# Patient Record
Sex: Female | Born: 1990 | Hispanic: Yes | Marital: Single | Smoking: Never smoker
Health system: Southern US, Community
[De-identification: ages and names within clinical notes are randomized; demographics above are authoritative.]

## PROBLEM LIST (undated history)

## (undated) DIAGNOSIS — K838 Other specified diseases of biliary tract: Secondary | ICD-10-CM

## (undated) DIAGNOSIS — K8020 Calculus of gallbladder without cholecystitis without obstruction: Secondary | ICD-10-CM

---

## 2013-02-24 ENCOUNTER — Ambulatory Visit: Payer: No Typology Code available for payment source

## 2013-03-03 ENCOUNTER — Ambulatory Visit (INDEPENDENT_AMBULATORY_CARE_PROVIDER_SITE_OTHER): Payer: No Typology Code available for payment source | Admitting: Family Medicine

## 2013-03-03 ENCOUNTER — Encounter: Payer: Self-pay | Admitting: Family Medicine

## 2013-03-03 VITALS — BP 117/85 | HR 72 | Temp 99.0°F | Ht 60.0 in | Wt 135.4 lb

## 2013-03-03 DIAGNOSIS — R350 Frequency of micturition: Secondary | ICD-10-CM | POA: Insufficient documentation

## 2013-03-03 DIAGNOSIS — N912 Amenorrhea, unspecified: Secondary | ICD-10-CM

## 2013-03-03 LAB — POCT UA - MICROSCOPIC ONLY

## 2013-03-03 LAB — POCT URINALYSIS DIPSTICK
Bilirubin, UA: NEGATIVE
Glucose, UA: NEGATIVE
Ketones, UA: NEGATIVE
Leukocytes, UA: NEGATIVE
Protein, UA: NEGATIVE
pH, UA: 7.5

## 2013-03-03 NOTE — Assessment & Plan Note (Addendum)
PMHx significant for UTI as adolescent. Now urinary frequency. No signs of pyelonephritis.  UA. Will treat accordingly pending results.

## 2013-03-03 NOTE — Progress Notes (Signed)
Family Medicine Office Visit Note   Subjective:   Patient ID: Isabella Archer, female  DOB: [birth year], 22 y.o.. MRN: [id number]   Pt that comes today to establish care in our clinic.  She reports PMHx significant for frequent UTI in the past.   Her other concern is amenorrhea: she reports menstrual period in August 6, and no other menses until October 22. This last period lasted the same amount of time and bleeding was slightly more. She has no concerns about pregnancy since she reports no sexual partner for more than 4 months.   She does reports mild frequency but no dysuria, no flank pain, nausea, vomiting , chills or fever.   Review of Systems:  Pt denies SOB, chest pain, palpitations, headaches, dizziness, numbness or weakness. No changes on urinary or BM habits. No unintentional weigh loss/gain.  Objective:   Physical Exam: Gen:  NAD HEENT: Moist mucous membranes  CV: Regular rate and rhythm, no murmurs rubs or gallops PULM: Clear to auscultation bilaterally. No wheezes/rales/rhonchi ABD: Soft, non tender, non distended, normal bowel sounds. No CVA tenderness.  EXT: No edema Neuro: Alert and oriented x3. No focalization Pt refused to vaginal exam today.  Assessment & Plan:

## 2013-03-03 NOTE — Assessment & Plan Note (Signed)
Irregular menses most likely anovulation.   Sexually active but no sexual intercourse in the last 4 months. No birth control. Plan/ Pregnancy test First time this has happened to her. Exam is reassuring.  Discussed birth control as option for treatment, but at this time will only monitor her menses.  F/u in 3 months or sooner if needed.

## 2013-03-03 NOTE — Patient Instructions (Signed)
Ha sido un placer verle hoy. - Yo le comunicare si los resultados de sus analisis estan [place], de lo contrario lo conversaremos en su proxima cita. - Haga su proxima cita si su problema persiste o tiene nuevos sintomas. De lo contrario su checheo medico es annual.

## 2014-01-24 ENCOUNTER — Encounter (HOSPITAL_COMMUNITY): Payer: Self-pay | Admitting: Emergency Medicine

## 2014-01-24 ENCOUNTER — Emergency Department (HOSPITAL_COMMUNITY): Payer: Self-pay

## 2014-01-24 ENCOUNTER — Emergency Department (HOSPITAL_COMMUNITY)
Admission: EM | Admit: 2014-01-24 | Discharge: 2014-01-24 | Disposition: A | Discharge status: Home / Self Care | Payer: Self-pay | Attending: Emergency Medicine | Admitting: Emergency Medicine

## 2014-01-24 DIAGNOSIS — K8070 Calculus of gallbladder and bile duct without cholecystitis without obstruction: Secondary | ICD-10-CM | POA: Insufficient documentation

## 2014-01-24 DIAGNOSIS — Z3202 Encounter for pregnancy test, result negative: Secondary | ICD-10-CM | POA: Insufficient documentation

## 2014-01-24 DIAGNOSIS — R7989 Other specified abnormal findings of blood chemistry: Secondary | ICD-10-CM | POA: Insufficient documentation

## 2014-01-24 DIAGNOSIS — R1011 Right upper quadrant pain: Secondary | ICD-10-CM | POA: Insufficient documentation

## 2014-01-24 DIAGNOSIS — K8050 Calculus of bile duct without cholangitis or cholecystitis without obstruction: Secondary | ICD-10-CM

## 2014-01-24 DIAGNOSIS — R945 Abnormal results of liver function studies: Secondary | ICD-10-CM

## 2014-01-24 DIAGNOSIS — K8020 Calculus of gallbladder without cholecystitis without obstruction: Secondary | ICD-10-CM

## 2014-01-24 LAB — COMPREHENSIVE METABOLIC PANEL
ALK PHOS: 145 U/L — AB (ref 39–117)
ALT: 233 U/L — ABNORMAL HIGH (ref 0–35)
AST: 359 U/L — AB (ref 0–37)
Albumin: 4.4 g/dL (ref 3.5–5.2)
Anion gap: 12 (ref 5–15)
BILIRUBIN TOTAL: 0.7 mg/dL (ref 0.3–1.2)
BUN: 9 mg/dL (ref 6–23)
CHLORIDE: 102 meq/L (ref 96–112)
CO2: 26 meq/L (ref 19–32)
Calcium: 9.4 mg/dL (ref 8.4–10.5)
Creatinine, Ser: 0.57 mg/dL (ref 0.50–1.10)
GLUCOSE: 103 mg/dL — AB (ref 70–99)
POTASSIUM: 3.8 meq/L (ref 3.7–5.3)
SODIUM: 140 meq/L (ref 137–147)
Total Protein: 8.1 g/dL (ref 6.0–8.3)

## 2014-01-24 LAB — URINALYSIS, ROUTINE W REFLEX MICROSCOPIC
Bilirubin Urine: NEGATIVE
Glucose, UA: NEGATIVE mg/dL
HGB URINE DIPSTICK: NEGATIVE
KETONES UR: NEGATIVE mg/dL
Leukocytes, UA: NEGATIVE
NITRITE: NEGATIVE
PH: 7.5 (ref 5.0–8.0)
Protein, ur: NEGATIVE mg/dL
SPECIFIC GRAVITY, URINE: 1.015 (ref 1.005–1.030)
UROBILINOGEN UA: 1 mg/dL (ref 0.0–1.0)

## 2014-01-24 LAB — CBC WITH DIFFERENTIAL/PLATELET
BASOS ABS: 0 10*3/uL (ref 0.0–0.1)
Basophils Relative: 0 % (ref 0–1)
EOS ABS: 0.1 10*3/uL (ref 0.0–0.7)
Eosinophils Relative: 1 % (ref 0–5)
HCT: 38.1 % (ref 36.0–46.0)
Hemoglobin: 12.9 g/dL (ref 12.0–15.0)
LYMPHS PCT: 28 % (ref 12–46)
Lymphs Abs: 2.2 10*3/uL (ref 0.7–4.0)
MCH: 29.1 pg (ref 26.0–34.0)
MCHC: 33.9 g/dL (ref 30.0–36.0)
MCV: 86 fL (ref 78.0–100.0)
MONO ABS: 1 10*3/uL (ref 0.1–1.0)
Monocytes Relative: 13 % — ABNORMAL HIGH (ref 3–12)
Neutro Abs: 4.5 10*3/uL (ref 1.7–7.7)
Neutrophils Relative %: 58 % (ref 43–77)
PLATELETS: 310 10*3/uL (ref 150–400)
RBC: 4.43 MIL/uL (ref 3.87–5.11)
RDW: 13.3 % (ref 11.5–15.5)
WBC: 7.8 10*3/uL (ref 4.0–10.5)

## 2014-01-24 LAB — LIPASE, BLOOD: Lipase: 26 U/L (ref 11–59)

## 2014-01-24 LAB — POC URINE PREG, ED: PREG TEST UR: NEGATIVE

## 2014-01-24 MED ORDER — ONDANSETRON HCL 4 MG PO TABS: 4.0000 mg | ORAL_TABLET | Freq: Three times a day (TID) | ORAL | Status: DC | PRN

## 2014-01-24 MED ORDER — SODIUM CHLORIDE 0.9 % IV BOLUS (SEPSIS)
1000.0000 mL | Freq: Once | INTRAVENOUS | Status: AC
  Administered 2014-01-24: 1000 mL via INTRAVENOUS

## 2014-01-24 MED ORDER — ONDANSETRON HCL 4 MG/2ML IJ SOLN
4.0000 mg | Freq: Once | INTRAMUSCULAR | Status: AC
  Administered 2014-01-24: 4 mg via INTRAVENOUS
  Filled 2014-01-24: qty 2

## 2014-01-24 MED ORDER — CIPROFLOXACIN HCL 500 MG PO TABS: 500.0000 mg | ORAL_TABLET | Freq: Two times a day (BID) | ORAL | Status: DC

## 2014-01-24 MED ORDER — OXYCODONE-ACETAMINOPHEN 5-325 MG PO TABS: 1.0000 | ORAL_TABLET | ORAL | Status: DC | PRN

## 2014-01-24 MED ORDER — HYDROMORPHONE HCL 1 MG/ML IJ SOLN
1.0000 mg | INTRAMUSCULAR | Status: AC
  Administered 2014-01-24: 1 mg via INTRAVENOUS
  Filled 2014-01-24: qty 1

## 2014-01-24 NOTE — ED Notes (Signed)
PA at BS.  

## 2014-01-24 NOTE — ED Notes (Addendum)
Pt reports fever x 4 days. Having severe RUQ pain and vomiting, pain radiates through to her back. Denies any diarrhea.

## 2014-01-24 NOTE — ED Provider Notes (Signed)
CSN: [id number]     Arrival date & time 01/24/14  1631 History   First MD Initiated Contact with Patient 01/24/14 1913     Chief Complaint  Patient presents with  . Abdominal Pain  . Fever     (Consider location/radiation/quality/duration/timing/severity/associated sxs/prior Treatment) HPI  Patient p/w subjective fever, RUQ, bloating, and nausea that has been intermittent x 4 days.  The pain is intermittent, lasts for hours at a time, seems to be random whether she eats or not.  Radiates through to her back. Pain is severe.  Has taken tylenol and charcoal pills with very mild temporary relief.  Has chronic constipation that is unchanged, last BM was today.  Denies urinary or vaginal complaints.  LMP last week.   History reviewed. No pertinent past medical history. History reviewed. No pertinent past surgical history. History reviewed. No pertinent family history. History  Substance Use Topics  . Smoking status: Never Smoker   . Smokeless tobacco: Not on file  . Alcohol Use: No   OB History   Grav Para Term Preterm Abortions TAB SAB Ect Mult Living                 Review of Systems  All other systems reviewed and are negative.     Allergies  Review of patient's allergies indicates no known allergies.  Home Medications   Prior to Admission medications   Not on File   BP 117/74  Pulse 82  Temp(Src) 98.3 F (36.8 C) (Oral)  Resp 16  SpO2 100%  LMP 01/17/2014 Physical Exam  Nursing note and vitals reviewed. Constitutional: She appears well-developed and well-nourished. No distress.  HENT:  Head: Normocephalic and atraumatic.  Neck: Neck supple.  Cardiovascular: Normal rate and regular rhythm.   Pulmonary/Chest: Effort normal and breath sounds normal. No respiratory distress. She has no wheezes. She has no rales.  Abdominal: Soft. She exhibits no distension. There is tenderness in the right upper quadrant. There is no rebound and no guarding.  Neurological: She is  alert.  Skin: She is not diaphoretic.    ED Course  Procedures (including critical care time) Labs Review Labs Reviewed  COMPREHENSIVE METABOLIC PANEL - Abnormal; Notable for the following:    Glucose, Bld 103 (*)    AST 359 (*)    ALT 233 (*)    Alkaline Phosphatase 145 (*)    All other components within normal limits  CBC WITH DIFFERENTIAL - Abnormal; Notable for the following:    Monocytes Relative 13 (*)    All other components within normal limits  LIPASE, BLOOD  URINALYSIS, ROUTINE W REFLEX MICROSCOPIC  POC URINE PREG, ED    Imaging Review US Abdomen Complete  01/24/2014   CLINICAL DATA:  Right upper quadrant abdominal pain, elevated liver function tests.  EXAM: ULTRASOUND ABDOMEN COMPLETE  COMPARISON:  None.  FINDINGS: Gallbladder:  Multiple gallstones are noted without gallbladder wall thickening or pericholecystic fluid. No sonographic Murphy's sign is noted.  Common bile duct:  Diameter: Measures 8 mm which is mildly dilated.  Liver:  No focal lesion identified. Within normal limits in parenchymal echogenicity.  IVC:  No abnormality visualized.  Pancreas:  Visualized portion unremarkable.  Spleen:  Size and appearance within normal limits.  Right Kidney:  Length: 10 cm. Echogenicity within normal limits. No mass or hydronephrosis visualized.  Left Kidney:  Length: 10.7 cm. Echogenicity within normal limits. No mass or hydronephrosis visualized.  Abdominal aorta:  No aneurysm visualized.  Other findings:  None.  IMPRESSION: Cholelithiasis is noted without evidence of cholecystitis. Mild dilatation of common bile duct is noted; given the history of elevated liver function tests, MRCP is recommended to evaluate for possible distal common bile duct obstruction.   Electronically Signed   By: Roque Lias M.D.   On: 01/24/2014 21:11     EKG Interpretation None      10:27 PM Discussed pt with Dr Silverio Lay.    10:43 PM Discussed patient with Dr Rhea Belton.  He agrees with outpatient ERCP,  the office will call patient tomorrow to have ERCP performed either tomorrow or the next day. Recommends d/c with percocet, cipro, very strict return precautions.  Pt is feeling much better and requests to be discharged home.  She has tenderness in the RUQ but no rebound or guarding and it is much improved from prior exam.    MDM   Final diagnoses:  Symptomatic cholelithiasis  Choledocholithiasis  Elevated LFTs    Afebrile, nontoxic patient with RUQ pain, nausea, intermittent x 4 days.  LFTs elevated.  US shows cholelithiasis and mildly dilitation of CBD.  Suspect choledocholithiasis.  No fever, normal WBC count, doubt cholecystitis or cholangitis.  Pt feeling much better and requested d/c home.  Discussed with GI doctor on call, please see conversation above.  Pt tolerating PO.   D/C home with percocet, zofran, cipro, GI follow up.  Strict return precautions discussed.   Discussed result, findings, treatment, and follow up  with patient.  Pt given return precautions.  Pt verbalizes understanding and agrees with plan.         Cecilia, PA-C 01/25/14 0008

## 2014-01-24 NOTE — ED Notes (Signed)
Discharge instructions reviewed with pt's father.

## 2014-01-24 NOTE — ED Notes (Signed)
Communicated with pt via interpretor phone.

## 2014-01-24 NOTE — ED Notes (Signed)
Pt given water per orders.

## 2014-01-24 NOTE — Discharge Instructions (Signed)
Read the information below.  Use the prescribed medication as directed.  Please discuss all new medications with your pharmacist.  Do not take additional tylenol while taking the prescribed pain medication to avoid overdose.  You may return to the Emergency Department at any time for worsening condition or any new symptoms that concern you.  If you develop high fevers, worsening abdominal pain, uncontrolled vomiting, or are unable to tolerate fluids by mouth, return to the ER for a recheck.    Lea la informacin a continuacin. Use el medicamento recetado como se indica. Por favor, discuta todos los nuevos medicamentos con su farmacutico. No tome Tylenol adicional mientras est tomando el medicamento para el dolor recetada para evitar sobredosis. Usted puede volver a la sala de Oceanographer en cualquier momento por el empeoramiento de la condicin o nuevos sntomas que le preocupan . Si usted desarrolla fiebre alta, empeoramiento del dolor abdominal, vmitos incontrolados , o es incapaz de Web designer lquidos por va oral , volver a la sala de Associate Professor.  The gastroenterologist office will call you tomorrow for a close follow up appointment.  If you have any fevers and any worsening abdominal pain, return to the ER immediately.    La oficina Armed forces operational officer por un estrecho seguimiento cita. Si tiene [place] y dolor abdominal que empeora, volver a la sala de Sports administrator de inmediato .    Clico biliar (Biliary Colic)  El clico biliar es un dolor continuo o irregular en la zona superior del abdomen. Generalmente se ubica debajo de la zona derecha de la caja torcica. Aparece cuando los clculos biliares interfieren con el flujo normal de la bilis que proviene de la vescula. La bilis es un lquido que interviene en la digestin de las Hooper. Se produce en el hgado y se almacena en la vescula. Al comer, La bilis pasa desde la vescula, a travs del conducto cstico y el conducto  biliar comn al intestino delgado. All se mezcla con la comida parcialmente digerida. Si un clculo obstruye alguno de esos conductos, se detiene el flujo normal de bilis. Las clulas del conducto biliar se contraen con fuerza para mover el clculo. Esto causa el dolor del clico biliar.  SNTOMAS  El paciente se queja de dolor en la zona superior del abdomen. El dolor puede ser:  En el centro de la zona superior del abdomen, justo por debajo del esternn.  En la zona superior derecha del abdomen, donde se encuentra la vescula biliar y el hgado.  Se expande hacia la espalda, hacia el omplato derecho.  Nuseas y vmitos  El dolor comienza generalmente despus de comer.  El clico biliar aparece como una demanda de bilis por parte del sistema digestivo. La demanda de bilis es mayor luego de ingerir alimentos ricos en grasas. Los sntomas tambin Armed forces operational officer que han estado ayunando e ingieren abruptamente una comida abundante. La mayora de los episodios de clico biliar mejoran luego de 1 a 5 horas. Despus que se alivia el dolor ms intenso, podr seguir sintiendo un dolor moderado en el abdomen durante un lapso de 24 horas. DIAGNSTICO Luego de escuchar la descripcin de los sntomas, el mdico realizar un examen fsico. Deber prestar atencin a la zona superior del abdomen. Esta es la zona en la que se encuentra el hgado y la vescula biliar. El mdico podr observar los clculos a travs de una ecografa. Tambin le realizaran un escaneo especializado de la vescula biliar. Le indicarn anlisis de [place],  especialmente si tiene fiebre o el dolor persiste. PREVENCIN El clico biliar puede evitarse controlando los factores de riesgo que favorecen los clculos. Algunos de [employer] factores de riesgo como la herencia, el aumento de la edad y Firefighter son aspectos normales de la vida. La obesidad y [place] dieta rica en grasas son factores de riesgo que usted puede modificar a  travs de cambios hacia un estilo de vida saludable. Las mujeres que atraviesan la menopausia y que reciben terapia de reemplazo hormonal (estrgenos) tambin tienen ms riesgo de Environmental education officer clicos biliares. TRATAMIENTO  Le prescribirn analgsicos.  Le indicarn una dieta sin grasas.  Si el primer episodio es intenso, o si los clicos aparecen nuevamente, generalmente se indica la ciruga para extirpar la vescula (colecistectoma). Este procedimiento puede realizarse a travs de pequeas incisiones utilizando un instrumento denominado laparoscopio. Muchas veces se requiere una breve estada en el hospital. Algunas personas reciben el alta el mismo da. Es el tratamiento ms ampliamente utilizado en personas que sufren dolor por clculos biliares. Es efectivo y seguro, no tiene complicaciones en ms del 90% de los [place].  Si la ciruga no puede llevarse a cabo, podrn utilizarse medicamentos para [employer] clculos. Esta medicacin es cara y puede demorar meses o aos hasta que Jensen. Slo podr disolver clculos pequeos.  En algunos casos raros, se combinan estos medicamentos con un procedimiento denominado litotricia por ondas de choque. Este procedimiento [place] ondas de choque cuidadosamente dirigidas a romper los clculos. En muchas personas tratadas con este procedimiento, los clculos vuelven a formarse luego de [employer]. PRONSTICO Si los clculos obstruyen el conducto cstico o conducto biliar comn, usted tiene el riesgo de sufrir episodios repetidos de clicos biliares. Tambin existe un 25% de probabilidades de desarrollar una infeccin de la vescula biliar (colecistitisaguda) o alguna otra complicacin en los siguientes 10 a 20 aos. Si ha sido sometido a [place], progrmela para el momento en que sea conveniente para usted, y para cuando no se encuentre enfermo. INSTRUCCIONES PARA EL CUIDADO DOMICILIARIO  Beba gran cantidad de lquidos claros.  Evite las  comidas con mucha grasa o fritas, o cualquier alimento que empeore su dolor.  Tome los medicamentos como se le indic. SOLICITE ATENCIN MDICA SI:  Le sube la temperatura a ms de 100.5 F (38.1 C).  El dolor empeora con el [place].  Siente nuseas y [employer] comer y beber.  Tiene vmitos. SOLICITE ATENCIN MDICA DE INMEDIATO SI:  Siente dolor continuo e intenso en el abdomen, que no se alivia con medicamentos.  Siente nuseas y vmitos que no mejoran con medicamentos.  Tiene sntomas de clico biliar y comienza a Lodema Pilot y escalofros. Esto puede ser un indicio de que ha desarrollado colecistitis. Comunquese con su mdico inmediatamente.  Su piel o la parte blanca del ojo se vuelven amarillas (ictericia). Document Released: 07/28/2007 Document Revised: 07/13/2011 [facility] Patient Information 2015 [place], [place]. This information is not intended to replace advice given to you by your health care provider. Make sure you discuss any questions you have with your health care provider.    Emergency Department Resource Guide 1) Find a Doctor and Pay Out of Pocket Although you won't have to find out who is covered by your insurance plan, it is a good idea to ask around and get recommendations. You will then need to call the office and see if the doctor you have chosen will accept you as a new patient and what types of options they  offer for patients who are self-pay. Some doctors offer discounts or will set up payment plans for their patients who do not have insurance, but you will need to ask so you aren't surprised when you get to your appointment.  2) Contact Your Local Health Department Not all health departments have doctors that can see patients for sick visits, but many do, so it is worth a call to see if yours does. If you don't know where your local health department is, you can check in your phone book. The CDC also has a tool to help you locate your state's health  department, and many state websites also have listings of all of their local health departments.  3) Find a Walk-in Clinic If your illness is not likely to be very severe or complicated, you may want to try a walk in clinic. These are popping up all over the country in pharmacies, drugstores, and shopping centers. They're usually staffed by nurse practitioners or physician assistants that have been trained to treat common illnesses and complaints. They're usually fairly quick and inexpensive. However, if you have serious medical issues or chronic medical problems, these are probably not your best option.  No Primary Care Doctor: - Call Health Connect at  [phone] - they can help you locate a primary care doctor that  accepts your insurance, provides certain services, etc. - Physician Referral Service- [phone]  Chronic Pain Problems: Organization         Address  Phone   Notes  [facility]  [phone] Patients need to be referred by their primary care doctor.   Medication Assistance: Organization         Address  Phone   Notes  [facility] Medication [facility] [address] [place], [place] [address] [phone] --Must be a resident of [facility] -- Must have NO insurance coverage whatsoever (no Medicaid/ Medicare, etc.) -- The pt. MUST have a primary care doctor that directs their care regularly and follows them in the community   MedAssist  [phone]   [employer]  [phone]    Agencies that provide inexpensive medical care: Organization         Address  Phone   Notes  [facility]  [phone]   [facility]    [phone]   [facility] [address] [place], [place] [address] [phone]   [facility] [address] [place], [place] [phone]   Planned Parenthood    [phone]   [facility]    [phone]    [facility]  [address], [place] Phone:  [phone], Fax:  [phone] Hours of Operation:  9 am - 6 pm, M-F.  Also accepts Medicaid/Medicare and self-pay.  [facility]  [address], [place] Phone: [phone], Fax: [phone]. Hours of Operation:  8:30 am - 5:30 pm, M-F.  Also accepts Medicaid and self-pay.  [facility] [place] [address] [place] Phone: [phone]   [facility] [address] Natasha Bence [place], [place] [phone], Ext. [phone] Mondays & Thursdays: 7-9 AM.  First 15 patients are seen on a first come, first serve basis.    Medicaid-accepting [facility] Providers:  Organization         Address  Phone   Notes  [facility] [address], [place] [phone] Also accepts self-pay patients.  [facility] [address] Laurell Josephs [place], [place]  [phone]   [facility] [address], [place] [phone]   [facility] [address], [place] [phone]   Renaye Rakers [address], [place]   [phone] Only accepts [place] Access [place] patients after they have their name applied to their card.   Self-Pay (no insurance) in [facility]:  Organization         Address  Phone   Notes  Sickle Cell Patients, [facility] Internal Medicine [address] [place], [place] [phone]   [facility] [address] [place], [place] [phone]   [facility] [place]  [address], [place] [phone]   [facility]/Dr. Osei-Bonsu  [address], [place] or [address], [place] [phone] Phone number for both [place] and [place] locations is the same.  [facility] [address] [place] [phone]    [facility] [address], [place] or [address] [phone]   [facility] [address] [place] [phone], phone; [phone], fax Sees patients 1st and 3rd Saturday of every month.  Must not qualify for public or private insurance (i.e. Medicaid, Medicare, [place] Health Choice, Veterans' Benefits)  Household income should be no more than 200% of the poverty level The clinic cannot treat you if you are pregnant or think you are pregnant  Sexually transmitted diseases are not treated at the clinic.    Dental Care: Organization         Address  Phone  Notes  [facility] Department of [facility] [address] [place], [place] [phone] Accepts children up to age 51 who are enrolled in [place] or [place] Health Choice; pregnant women with a Medicaid card; and children who have applied for Medicaid or [place] Health Choice, but were declined, whose parents can pay a reduced fee at time of service.  [facility] Department of [facility]  [address], [place] [phone] Accepts children up to age 74 who are enrolled in [place] or [place] Health Choice; pregnant women with a Medicaid card; and children who have applied for Medicaid or  Health Choice, but were declined, whose parents can pay a reduced fee at time of service.  [facility]  [address] [place], [place] [phone] Patients are seen by appointment only. Walk-ins are not accepted. [facility] will see patients 82 years of age and older. Monday - Tuesday (8am-5pm) Most Wednesdays (8:30-5pm) $30 per visit, cash only  [facility] Adult Dental Access PROGRAM  [address], [facility] [phone] Patients are seen by appointment only. Walk-ins are not accepted. [facility] will see patients 55 years of age and older. One Wednesday Evening (Monthly: Volunteer Based).   $30 per visit, cash only  [employer]  [phone] for adults; Children under age 106, call Graduate Pediatric Dentistry at [phone]. Children aged 5-14, please call [phone] to request a pediatric application.  Dental services are provided in all areas of dental care including fillings, crowns and bridges, complete and partial dentures, implants,  gum treatment, root canals, and extractions. Preventive care is also provided. Treatment is provided to both adults and children. Patients are selected via a lottery and there is often a waiting list.   [facility] [address] [place]  [phone] [email or web address]   [facility] [address] [place], [place] [phone], Ext. [phone] Second and Fourth Thursday of each month, opens at 6:30 AM; Clinic ends at 9 AM.  Patients are seen on a first-come first-served basis, and a limited number are seen during each clinic.   [facility]  [address] Ether Griffins [place], [place] [phone]   Eligibility Requirements You must have lived in [place], [place], or [place] counties for at least the last three months.   You cannot be eligible for state or federal sponsored [place], including [employer], [place], or [employer].   You generally cannot be eligible for healthcare insurance through your employer.    How to apply: Eligibility screenings are held every Tuesday and Wednesday afternoon from 1:00 pm until 4:00 pm. You do not need an appointment for the interview!  [facility] [address] [place], [place] [phone]   [facility] Health Department  [phone]   [facility] Health Department  [phone]   [facility] Health Department  [phone]    Behavioral Health Resources in the Community: Intensive Outpatient Programs Organization         Address  Phone  Notes  [facility] Services [address], [place], [place] [phone]   [facility] [address] [place], [place] [phone]   [facility] [address] [place], [place]  [phone]   [facility] Mental Health [address],  [place], [place] [phone] or [phone]   Substance Abuse Resources Organization         Address  Phone  Notes  [facility]  [phone]   [facility]  [phone]   [facility]  [phone]   Floydene Flock  [phone]   Residential & Outpatient Substance Abuse Program  [phone]   Psychological Services Organization         Address  Phone  Notes  [facility] Behavioral Health  [phone]    [facility]  [phone]   [facility] Mental Health [address] [place] [phone] or [phone]    Mobile Crisis Teams Organization         Address  Phone  Notes  [facility], Mobile Crisis Care Unit  [phone]   [facility]  [address] [place], [place] [phone]   Doristine Locks [address] [place] [phone]    Self-Help/Support Groups Organization         Address  Phone             Notes  [facility] [place] - variety of support groups  336- I7437963 Call for more information  Narcotics Anonymous (NA), [facility] [address], [employer] [place]  2 meetings at this location   Statistician         Address  Phone  Notes  [facility] [address],    [place]  [phone]   [facility]  [address] [place] [address], [place], [place] [phone]   [facility] [address] [place] [address] [place], [place] [phone] Admissions: 8am-3pm M-F  [facility] [address],    [place], [place] [phone]   [facility] [address] [place], [place], [place] [phone]   [facility] [address],  [place], [place] [phone]   [facility] - Intensive Outpatient [address]ure[address] Joseph[address], [place], [place] [phone]   [facility] (Addiction Recovery [facility] [address]  [place], [place] [phone] or [phone]   [facility] ([facility]) [address] [place], [place] [phone] Accepts Medicaid  [facility] [address]  [place] [phone] Substance Abuse/Addiction Treatment   [facility] Organization         Address  Phone  Notes  [facility]  [phone]   Angie Fava, PhD [address] Ervin Knack [place], [place]   [phone] or [phone]   [facility]   [address] [place], [place] [phone]   [facility] [address], [place], [place] [phone] Insurance/Medicaid/sponsorship through [facility] and Families [address]                                    [place], [place] [phone] Therapy/tele-psych/case  [facility] [address], [place] [phone]    Dr. Lolly Mustache  [phone]   [facility]  [employer] [facility] Dept. 1) [address] [place] 2) [address], [place] 3)  [address], [place] [phone]  [phone]   [facility] Child Abuse Hotline 651-605-1171 or [phone] (After Hours)

## 2014-01-25 ENCOUNTER — Telehealth: Payer: Self-pay

## 2014-01-25 ENCOUNTER — Other Ambulatory Visit: Payer: Self-pay

## 2014-01-25 ENCOUNTER — Encounter (HOSPITAL_COMMUNITY): Payer: Self-pay

## 2014-01-25 DIAGNOSIS — K838 Other specified diseases of biliary tract: Secondary | ICD-10-CM

## 2014-01-25 NOTE — ED Provider Notes (Signed)
Medical screening examination/treatment/procedure(s) were performed by non-physician practitioner and as supervising physician I was immediately available for consultation/collaboration.   EKG Interpretation None        Robyn Nohr H Rykker Coviello, MD 01/25/14 1043 

## 2014-01-25 NOTE — Telephone Encounter (Signed)
The pt has been scheduled for EUS ERCP tomorrow at [facility] 1 pm labs also entered for today

## 2014-01-25 NOTE — Telephone Encounter (Signed)
EUS/ERCP tomorrow liver test today Noreene Larsson working on getting the case at [facility] with [facility]

## 2014-01-25 NOTE — Telephone Encounter (Signed)
EUS scheduled, pt instructed and medications reviewed.  Patient instructions mailed to home.  Patient to call with any questions or concerns.  [facility] spoke with the pt and translated the instructions the pt will come in today for labs

## 2014-01-25 NOTE — Progress Notes (Signed)
Spoke with pt via Spanish interpreter Mario #Marquita [phone] and pt stated that she is cancelling her procedure for tomorrow because she has no family here in [place]. She will have the procedure done in another city.

## 2014-01-26 ENCOUNTER — Encounter (HOSPITAL_COMMUNITY): Admission: RE | Payer: Self-pay | Source: Ambulatory Visit

## 2014-01-26 ENCOUNTER — Encounter (HOSPITAL_COMMUNITY): Payer: Self-pay | Admitting: Anesthesiology

## 2014-01-26 ENCOUNTER — Ambulatory Visit (HOSPITAL_COMMUNITY): Admission: RE | Admit: 2014-01-26 | Payer: Self-pay | Source: Ambulatory Visit | Admitting: Gastroenterology

## 2014-01-26 ENCOUNTER — Telehealth: Payer: Self-pay | Admitting: Gastroenterology

## 2014-01-26 SURGERY — ESOPHAGEAL ENDOSCOPIC ULTRASOUND (EUS) RADIAL
Anesthesia: General

## 2014-01-26 NOTE — Telephone Encounter (Signed)
She spoke with pre-admit RN last night and wanted to cancel the appt for EUS +/- ERCP.  Endo staff today called her and verified her decision, it sounds like she will be getting care elsewhere.

## 2014-06-04 HISTORY — PX: LAPAROSCOPIC CHOLECYSTECTOMY: SUR755

## 2015-03-25 ENCOUNTER — Inpatient Hospital Stay (HOSPITAL_COMMUNITY)
Admission: EM | Admit: 2015-03-25 | Discharge: 2015-03-27 | DRG: 444 | Disposition: A | Discharge status: Home / Self Care | Payer: Self-pay | Attending: Family Medicine | Admitting: Family Medicine

## 2015-03-25 ENCOUNTER — Encounter (HOSPITAL_COMMUNITY): Payer: Self-pay | Admitting: *Deleted

## 2015-03-25 ENCOUNTER — Emergency Department (HOSPITAL_COMMUNITY): Payer: Self-pay

## 2015-03-25 DIAGNOSIS — R1114 Bilious vomiting: Secondary | ICD-10-CM | POA: Insufficient documentation

## 2015-03-25 DIAGNOSIS — K226 Gastro-esophageal laceration-hemorrhage syndrome: Secondary | ICD-10-CM | POA: Diagnosis present

## 2015-03-25 DIAGNOSIS — Z9049 Acquired absence of other specified parts of digestive tract: Secondary | ICD-10-CM

## 2015-03-25 DIAGNOSIS — K8051 Calculus of bile duct without cholangitis or cholecystitis with obstruction: Secondary | ICD-10-CM | POA: Diagnosis present

## 2015-03-25 DIAGNOSIS — R1011 Right upper quadrant pain: Secondary | ICD-10-CM | POA: Insufficient documentation

## 2015-03-25 DIAGNOSIS — K8050 Calculus of bile duct without cholangitis or cholecystitis without obstruction: Secondary | ICD-10-CM | POA: Diagnosis present

## 2015-03-25 DIAGNOSIS — K8041 Calculus of bile duct with cholecystitis, unspecified, with obstruction: Principal | ICD-10-CM | POA: Diagnosis present

## 2015-03-25 HISTORY — DX: Other specified diseases of biliary tract: K83.8

## 2015-03-25 HISTORY — DX: Calculus of gallbladder without cholecystitis without obstruction: K80.20

## 2015-03-25 LAB — COMPREHENSIVE METABOLIC PANEL
ALT: 373 U/L — AB (ref 14–54)
AST: 502 U/L — AB (ref 15–41)
Albumin: 4.2 g/dL (ref 3.5–5.0)
Alkaline Phosphatase: 195 U/L — ABNORMAL HIGH (ref 38–126)
Anion gap: 9 (ref 5–15)
BUN: 12 mg/dL (ref 6–20)
CO2: 25 mmol/L (ref 22–32)
CREATININE: 0.62 mg/dL (ref 0.44–1.00)
Calcium: 9.8 mg/dL (ref 8.9–10.3)
Chloride: 103 mmol/L (ref 101–111)
Glucose, Bld: 114 mg/dL — ABNORMAL HIGH (ref 65–99)
POTASSIUM: 4.1 mmol/L (ref 3.5–5.1)
SODIUM: 137 mmol/L (ref 135–145)
Total Bilirubin: 0.9 mg/dL (ref 0.3–1.2)
Total Protein: 8.1 g/dL (ref 6.5–8.1)

## 2015-03-25 LAB — URINALYSIS, ROUTINE W REFLEX MICROSCOPIC
Bilirubin Urine: NEGATIVE
GLUCOSE, UA: NEGATIVE mg/dL
HGB URINE DIPSTICK: NEGATIVE
Ketones, ur: 40 mg/dL — AB
LEUKOCYTES UA: NEGATIVE
Nitrite: NEGATIVE
PH: 6.5 (ref 5.0–8.0)
Protein, ur: NEGATIVE mg/dL
SPECIFIC GRAVITY, URINE: 1.011 (ref 1.005–1.030)

## 2015-03-25 LAB — CBC
HEMATOCRIT: 38.2 % (ref 36.0–46.0)
Hemoglobin: 12.2 g/dL (ref 12.0–15.0)
MCH: 28.6 pg (ref 26.0–34.0)
MCHC: 31.9 g/dL (ref 30.0–36.0)
MCV: 89.5 fL (ref 78.0–100.0)
Platelets: 292 10*3/uL (ref 150–400)
RBC: 4.27 MIL/uL (ref 3.87–5.11)
RDW: 13.3 % (ref 11.5–15.5)
WBC: 8.4 10*3/uL (ref 4.0–10.5)

## 2015-03-25 LAB — I-STAT BETA HCG BLOOD, ED (MC, WL, AP ONLY): I-stat hCG, quantitative: <5 m[IU]/mL (ref <5)

## 2015-03-25 LAB — LIPASE, BLOOD: Lipase: 25 U/L (ref 11–51)

## 2015-03-25 MED ORDER — SODIUM CHLORIDE 0.9 % IV SOLN
1000.0000 mL | Freq: Once | INTRAVENOUS | Status: AC
  Administered 2015-03-25: 1000 mL via INTRAVENOUS

## 2015-03-25 MED ORDER — MORPHINE SULFATE (PF) 2 MG/ML IV SOLN
2.0000 mg | INTRAVENOUS | Status: DC | PRN
  Administered 2015-03-26 – 2015-03-27 (×3): 2 mg via INTRAVENOUS
  Filled 2015-03-25 (×3): qty 1

## 2015-03-25 MED ORDER — SODIUM CHLORIDE 0.9 % IV SOLN
1000.0000 mL | INTRAVENOUS | Status: DC
  Administered 2015-03-25: 1000 mL via INTRAVENOUS

## 2015-03-25 MED ORDER — SODIUM CHLORIDE 0.9 % IV SOLN
INTRAVENOUS | Status: DC
Start: 2015-03-25 — End: 2015-03-25

## 2015-03-25 MED ORDER — DEXTROSE-NACL 5-0.9 % IV SOLN
INTRAVENOUS | Status: DC
  Administered 2015-03-26 (×2): via INTRAVENOUS

## 2015-03-25 MED ORDER — ONDANSETRON HCL 4 MG/2ML IJ SOLN
4.0000 mg | Freq: Four times a day (QID) | INTRAMUSCULAR | Status: DC | PRN
  Administered 2015-03-27: 4 mg via INTRAVENOUS

## 2015-03-25 MED ORDER — ONDANSETRON HCL 4 MG/2ML IJ SOLN
4.0000 mg | Freq: Once | INTRAMUSCULAR | Status: AC
Start: 2015-03-25 — End: 2015-03-25
  Administered 2015-03-25: 4 mg via INTRAVENOUS
  Filled 2015-03-25: qty 2

## 2015-03-25 MED ORDER — ONDANSETRON HCL 4 MG PO TABS: 4.0000 mg | ORAL_TABLET | Freq: Four times a day (QID) | ORAL | Status: DC | PRN

## 2015-03-25 MED ORDER — ONDANSETRON HCL 4 MG/2ML IJ SOLN: 4.0000 mg | Freq: Three times a day (TID) | INTRAMUSCULAR | Status: DC | PRN

## 2015-03-25 MED ORDER — PANTOPRAZOLE SODIUM 40 MG IV SOLR
40.0000 mg | INTRAVENOUS | Status: DC
  Administered 2015-03-26: 40 mg via INTRAVENOUS
  Filled 2015-03-25: qty 40

## 2015-03-25 MED ORDER — HYDROMORPHONE HCL 1 MG/ML IJ SOLN: 1.0000 mg | INTRAMUSCULAR | Status: DC | PRN

## 2015-03-25 MED ORDER — HEPARIN SODIUM (PORCINE) 5000 UNIT/ML IJ SOLN
5000.0000 [IU] | Freq: Three times a day (TID) | INTRAMUSCULAR | Status: DC
  Administered 2015-03-25 – 2015-03-26 (×3): 5000 [IU] via SUBCUTANEOUS
  Filled 2015-03-25 (×2): qty 1

## 2015-03-25 MED ORDER — MORPHINE SULFATE (PF) 4 MG/ML IV SOLN
4.0000 mg | Freq: Once | INTRAVENOUS | Status: AC
  Administered 2015-03-25: 4 mg via INTRAVENOUS
  Filled 2015-03-25: qty 1

## 2015-03-25 NOTE — ED Notes (Signed)
Pt reports upper abd pain x 2 months with nausea. Pain gets worse after she eats. Denies diarrhea.

## 2015-03-25 NOTE — H&P (Signed)
[facility] Admission History and Physical Service Pager: [phone]  Patient name: Isabella Archer Medical record number: [id number] Date of birth: 1991/04/30 Age: 24 y.o. Gender: female  Primary Care Provider: No PCP Per Patient Consultants: Gastroenterology  Code Status: Full  Chief Complaint: Abdominal pain, nausea, vomiting  Assessment and Plan: Isabella Archer is a 24 y.o. female presenting with abdominal pain, nausea, and vomiting 3 months. PMH is significant for s/p cholecystectomy on 06/2014 (in [place]). Patient has had intermittent nausea/vomiting/RUQ pain 3 months. Symptoms are worse with greasy food. Recent cholecystectomy. Lipase wnl, AST/ALT 502/373, alkaline phosphatase 195. Signs and symptoms consistent with cholecystitis/choledocholithiasis. Due to patient's history of cholecystectomy, choledocholithiasis is the likely Dx. this was confirmed with abdominal ultrasound with a CBD dilated to 11 mm. Will monitor for signs/symptoms of cholangitis.  Choledocholithiasis: RUQ pain (worse w/ greasy food) and elevated AST/ALT/Alk Phos. Confirmed with abdominal ultrasound with a CBD dilated to 11 mm. No current indications of cholangitis (fever) at this time.  - Admit to family medicine teaching service; Dr. Mingo Amber attending Center gastroenterology: They will see patient in the morning - NPO after midnight - Morphine 2-4 mg IV every 2 hours when necessary for pain - Zofran when necessary for nausea - D5/NS at 100 mL/hr  Hematemesis: Likely secondary to Mallory-Weiss tears from patient's persistent nausea and vomiting. - Protonix 10m IV >> transition to PO once no longer NPO  Hematochezia, subjective: Patient reported at least 2 episodes of hematochezia 2 weeks ago. Patient currently denies any symptoms more recently. - FOBT to be collected by RN   FEN/GI: D5/NS @100ml /hr, nothing by mouth after midnight Prophylaxis:  Subcutaneous heparin  Disposition: Pending medical improvement  History of Present Illness:  Isabella EAavya Shaferis a 24y.o. female presenting with nausea, vomiting, and abdominal pain 3 months. Patient states that she has had abdominal pain which has been increasing over the past 2 weeks. She states that she has been vomiting due to this pain and nausea shortly after the majority of her meals. The pain appears to be significantly worse when eating anything greasy. She reports more recent hematemesis over the past week with red flecks mixed in her vomit. Patient also reports one or 2 episodes of bilious vomit. Patient endorses occasional diarrhea. She reports 2 episodes of hematochezia approximately 2 weeks ago. She denies any recent fevers, chills, headache, chest pain, shortness of breath, or dysuria.  She denies any history of smoking, NSAID use, or significant coffee consumption. She endorsed some of all use but states she is only consumed approximately 3 drinks over the past month. Patient does report that she was told she had a "problem with her liver" as a child and that she should "avoid foods high in cholesterol". She did not know any other information about this.  Review Of Systems: Per HPI with the following additions: Denies fatigue or body aches. Otherwise the remainder of the systems were negative.  Patient Active Problem List   Diagnosis Date Noted  . Choledocholithiasis 03/25/2015  . Choledocholithiasis with obstruction 03/25/2015  . Bilious vomiting with nausea   . Abdominal pain, right upper quadrant   . Frequency of urination 03/03/2013  . Amenorrhea 03/03/2013    Past Medical History: History reviewed. No pertinent past medical history.  Past Surgical History: Past Surgical History  Procedure Laterality Date  . Cholecystectomy      Social History: Social History  Substance Use Topics  .  Smoking status: Never Smoker   . Smokeless tobacco: None  . Alcohol  Use: No   Additional social history: Originally from [place]  Please also refer to relevant sections of EMR.  Family History: History reviewed. No pertinent family history.  Allergies and Medications: No Known Allergies No current facility-administered medications on file prior to encounter.   Current Outpatient Prescriptions on File Prior to Encounter  Medication Sig Dispense Refill  . ondansetron (ZOFRAN) 4 MG tablet Take 1 tablet (4 mg total) by mouth every 8 (eight) hours as needed for nausea or vomiting. (Patient not taking: Reported on 03/25/2015) 12 tablet 0  . oxyCODONE-acetaminophen (PERCOCET/ROXICET) 5-325 MG per tablet Take 1-2 tablets by mouth every 4 (four) hours as needed for moderate pain or severe pain ((Dolor)). (Patient not taking: Reported on 03/25/2015) 20 tablet 0    Objective: BP 117/59 mmHg  Pulse 74  Temp(Src) 98.6 F (37 C) (Oral)  Resp 16  Ht 5' (1.524 m)  Wt 121 lb 0.5 oz (54.9 kg)  BMI 23.64 kg/m2  SpO2 100%  LMP 03/19/2015 Exam: General -- oriented x3, pleasant and cooperative. HEENT -- Head is normocephalic. PERRLA. EOMI. Ears, nose and throat were benign. Integument -- intact. No jaundice. No rash, erythema, or ecchymoses.  Chest -- good expansion. Lungs clear to auscultation. Cardiac -- RRR. No murmurs noted.  Abdomen -- soft. RUQ tenderness. +Murphy's.  No masses palpable. Bowel sounds present. CNS -- cranial nerves II through XII grossly intact. 2+ reflexes bilaterally. Extremeties - no tenderness or effusions noted. ROM good. 5/5 bilateral strength. Dorsalis pedis pulses present and symmetrical.    Labs and Imaging: CBC BMET   Recent Labs Lab 03/25/15 1300  WBC 8.4  HGB 12.2  HCT 38.2  PLT 292    Recent Labs Lab 03/25/15 1300  NA 137  K 4.1  CL 103  CO2 25  BUN 12  CREATININE 0.62  GLUCOSE 114*  CALCIUM 9.8     Abdominal US 11/21 IMPRESSION: Choledocholithiasis with dilatation of the common bile duct to 11 mm and  perhaps mild central intrahepatic biliary ductal dilatation. The distal common bile duct stone measures approximately 11 x 5 mm.   Elberta Leatherwood, MD 03/25/2015, 11:47 PM PGY-2, [facility] pager: [phone], text pages welcome

## 2015-03-25 NOTE — Progress Notes (Signed)
Pt arrived to 5W07. Pt is alert and oriented X4. Pt oriented to room and instructed how to use the phone and call bell. Phone and call bell at patient bedside within in reach. Skin is clean, dry, and intact per RN staff. Pt has small bruise on right upper thigh. IV in right hand saline locked. IV site is clean, dry, and intact. Pt is spanish speaking with limited English; pt's roommates are at bed side. Pt complains of 4/10 abdominal pain; received IV pain medicine in the ED and does not want anything for pain at this time.  Will continue to monitor and treat per MD orders.

## 2015-03-25 NOTE — ED Provider Notes (Signed)
CSN: [id number]     Arrival date & time 03/25/15  1225 History   First MD Initiated Contact with Patient 03/25/15 1600     Chief Complaint  Patient presents with  . Abdominal Pain  . Nausea     (Consider location/radiation/quality/duration/timing/severity/associated sxs/prior Treatment) HPI Patient had evaluation in September 2015 whereupon she had multiple gallstones and elevated LFTs. Subsequent to that she return to the [place] where she had a cholecystectomy done in February 2016. She reports she felt well for approximately 3 months after that. Now however for the past 2 months she's been getting pain, bloating and vomiting with eating. She reports the pain is in her right upper quadrant. It has worsened significantly now over the past several days. She denies fever. History reviewed. No pertinent past medical history. Past Surgical History  Procedure Laterality Date  . Cholecystectomy     History reviewed. No pertinent family history. Social History  Substance Use Topics  . Smoking status: Never Smoker   . Smokeless tobacco: None  . Alcohol Use: No   OB History    No data available     Review of Systems 10 Systems reviewed and are negative for acute change except as noted in the HPI.    Allergies  Review of patient's allergies indicates no known allergies.  Home Medications   Prior to Admission medications   Medication Sig Start Date End Date Taking? Authorizing Provider  ciprofloxacin (CIPRO) 500 MG tablet Take 1 tablet (500 mg total) by mouth 2 (two) times daily. One po bid x 7 days Patient not taking: Reported on 03/25/2015 01/24/14   Trixie DredgeEmily West, PA-C  ondansetron (ZOFRAN) 4 MG tablet Take 1 tablet (4 mg total) by mouth every 8 (eight) hours as needed for nausea or vomiting. Patient not taking: Reported on 03/25/2015 01/24/14   Trixie DredgeEmily West, PA-C  oxyCODONE-acetaminophen (PERCOCET/ROXICET) 5-325 MG per tablet Take 1-2 tablets by mouth every 4 (four) hours as needed  for moderate pain or severe pain ((Dolor)). Patient not taking: Reported on 03/25/2015 01/24/14   Trixie DredgeEmily West, PA-C   BP 119/62 mmHg  Pulse 67  Temp(Src) 99 F (37.2 C) (Oral)  Resp 20  Ht 5' (1.524 m)  Wt 119 lb 5 oz (54.12 kg)  BMI 23.30 kg/m2  SpO2 100%  LMP 03/19/2015 Physical Exam  Constitutional: She is oriented to person, place, and time. She appears well-developed and well-nourished.  HENT:  Head: Normocephalic and atraumatic.  Mouth/Throat: Oropharynx is clear and moist.  Eyes: EOM are normal. Pupils are equal, round, and reactive to light.  Neck: Neck supple.  Cardiovascular: Normal rate, regular rhythm, normal heart sounds and intact distal pulses.   Pulmonary/Chest: Effort normal and breath sounds normal.  Abdominal: Soft. Bowel sounds are normal. She exhibits no distension. There is tenderness.  Right upper quadrant and epigastric pain to palpation. I do not appreciate hepatomegaly. Remainder of the abdomen is soft.  Musculoskeletal: Normal range of motion. She exhibits no edema or tenderness.  Neurological: She is alert and oriented to person, place, and time. She has normal strength. She exhibits normal muscle tone. Coordination normal. GCS eye subscore is 4. GCS verbal subscore is 5. GCS motor subscore is 6.  Skin: Skin is warm, dry and intact.  Psychiatric: She has a normal mood and affect.    ED Course  Procedures (including critical care time) Labs Review Labs Reviewed  COMPREHENSIVE METABOLIC PANEL - Abnormal; Notable for the following:    Glucose, Bld 114 (*)  AST 502 (*)    ALT 373 (*)    Alkaline Phosphatase 195 (*)    All other components within normal limits  LIPASE, BLOOD  CBC  URINALYSIS, ROUTINE W REFLEX MICROSCOPIC (NOT AT [facility])  HEPATITIS PANEL, ACUTE  I-STAT BETA HCG BLOOD, ED ([facility], [facility], [facility] ONLY)    Imaging Review US Abdomen Limited  03/25/2015  CLINICAL DATA:  24 year old female with elevated LFTs status post cholecystectomy EXAM: US  ABDOMEN LIMITED - RIGHT UPPER QUADRANT COMPARISON:  Prior abdominal ultrasound 01/24/2014 FINDINGS: Gallbladder: Surgically absent. Common bile duct: Diameter: Dilated at 11 mm. Additionally, there is and echogenic structure with posterior acoustic shadowing in the distal common duct measuring 11 x 5 mm. Liver: No focal lesion identified. Within normal limits in parenchymal echogenicity. Mild intrahepatic biliary ductal dilatation. IMPRESSION: Choledocholithiasis with dilatation of the common bile duct to 11 mm and perhaps mild central intrahepatic biliary ductal dilatation. The distal common bile duct stone measures approximately 11 x 5 mm. Electronically Signed   By: Malachy Moan M.D.   On: 03/25/2015 18:07   I have personally reviewed and evaluated these images and lab results as part of my medical decision-making.   EKG Interpretation None     Case reviewed with Dr. Edrick Oh of GI. Nothing by mouth after midnight and will evaluate in the morning for definitive management. MDM   Final diagnoses:  Calculus of bile duct with obstruction and without cholangitis or cholecystitis   Patient is nontoxic alert. Vital signs are stable. Patient has choledocholithiasis with duct dilation. LFTs are elevated. Definitive management by GI with admission to family medicine service.    Arby Barrette, MD 03/25/15 [phone]

## 2015-03-26 ENCOUNTER — Encounter (HOSPITAL_COMMUNITY): Payer: Self-pay | Admitting: Physician Assistant

## 2015-03-26 DIAGNOSIS — K8050 Calculus of bile duct without cholangitis or cholecystitis without obstruction: Secondary | ICD-10-CM

## 2015-03-26 DIAGNOSIS — K8051 Calculus of bile duct without cholangitis or cholecystitis with obstruction: Secondary | ICD-10-CM | POA: Insufficient documentation

## 2015-03-26 DIAGNOSIS — R935 Abnormal findings on diagnostic imaging of other abdominal regions, including retroperitoneum: Secondary | ICD-10-CM

## 2015-03-26 DIAGNOSIS — R748 Abnormal levels of other serum enzymes: Secondary | ICD-10-CM

## 2015-03-26 LAB — COMPREHENSIVE METABOLIC PANEL
ALBUMIN: 3 g/dL — AB (ref 3.5–5.0)
ALK PHOS: 177 U/L — AB (ref 38–126)
ALT: 673 U/L — AB (ref 14–54)
AST: 563 U/L — AB (ref 15–41)
Anion gap: 4 — ABNORMAL LOW (ref 5–15)
BILIRUBIN TOTAL: 1.4 mg/dL — AB (ref 0.3–1.2)
CO2: 25 mmol/L (ref 22–32)
CREATININE: 0.6 mg/dL (ref 0.44–1.00)
Calcium: 8.4 mg/dL — ABNORMAL LOW (ref 8.9–10.3)
Chloride: 109 mmol/L (ref 101–111)
GFR calc Af Amer: >60 mL/min (ref >60)
GFR calc non Af Amer: >60 mL/min (ref >60)
GLUCOSE: 123 mg/dL — AB (ref 65–99)
POTASSIUM: 4.2 mmol/L (ref 3.5–5.1)
Sodium: 138 mmol/L (ref 135–145)
TOTAL PROTEIN: 5.8 g/dL — AB (ref 6.5–8.1)

## 2015-03-26 LAB — CBC
HEMATOCRIT: 32.5 % — AB (ref 36.0–46.0)
Hemoglobin: 10.7 g/dL — ABNORMAL LOW (ref 12.0–15.0)
MCH: 29.6 pg (ref 26.0–34.0)
MCHC: 32.9 g/dL (ref 30.0–36.0)
MCV: 90 fL (ref 78.0–100.0)
Platelets: 242 10*3/uL (ref 150–400)
RBC: 3.61 MIL/uL — ABNORMAL LOW (ref 3.87–5.11)
RDW: 13.5 % (ref 11.5–15.5)
WBC: 4.6 10*3/uL (ref 4.0–10.5)

## 2015-03-26 LAB — HEPATITIS PANEL, ACUTE
HCV Ab: 0.2 s/co ratio (ref 0.0–0.9)
HEP A IGM: NEGATIVE
Hep B C IgM: NEGATIVE
Hepatitis B Surface Ag: NEGATIVE

## 2015-03-26 NOTE — Care Management Note (Signed)
Case Management Note  Patient Details  Name: Donny Piquerleth [name] MRN: [id number] Date of Birth: 12/22/1990  Subjective/Objective:                 Independent patient from home, admitted with abdominal pain, post cholecystectomy 3 months ago. NPO, IVF, GI consult.   Action/Plan:  Spoke with Fam Med resident. They are to decide if their office is accepting patients. Patient had a visit in October 2014. If not, [facility]  Expected Discharge Date:                  Expected Discharge Plan:  Home/Self Care  In-House Referral:     Discharge planning Services  CM Consult  Post Acute Care Choice:    Choice offered to:     DME Arranged:    DME Agency:     HH Arranged:    HH Agency:     Status of Service:  In process, will continue to follow  Medicare Important Message Given:    Date Medicare IM Given:    Medicare IM give by:    Date Additional Medicare IM Given:    Additional Medicare Important Message give by:     If discussed at Long Length of Stay Meetings, dates discussed:    Additional Comments:  Lawerance SabalDebbie Nicholas Ossa, RN 03/26/2015, 2:14 PM

## 2015-03-26 NOTE — Consult Note (Addendum)
[facility] Consult: 2:49 PM 03/26/2015  LOS: 1 day    Referring Provider: Dr. Mingo Amber  Primary Care Physician:  No PCP Per Patient Primary Gastroenterologist:  unassigned     Reason for Consultation:  Choledocholithiasis   HPI: Isabella Archer is a 24 y.o. female.  Generally healthy young woman. Diagnosed with cholelithiasis and 30m CBD by ultrasound in 01/2014. LFTs elevated then: AST/ALT 359/233, alk phos 145.  Normal T bili.   An ERCP/EUS was arranged but pt cancelled the procedure as [place] tickets [place] to [place] there, in 06/2014, patient underwent uneventful laparoscopic cholecystectomy.  For a few months she was GI symptom free. However she subsequently had recurrent symptoms of upper abdominal pain, nausea and vomiting. These have become much worse in the last week. They are precipitated by greasy food..  Seen in ED yesterday.  Admitted to teaching service.  AST/ALT 563/637, alk phos 177, T bili 1.4. Ultrasound with 163mCBD, choledocholithiasis.   Family history positive for cholecystectomy in her mother and a maternal aunt. Patient doesn't drink alcohol.    Past Medical History  Diagnosis Date  . Cholelithiasis 01/2014.  . Dilated cbd, acquired 01/2014.    Past Surgical History  Procedure Laterality Date  . Laparoscopic cholecystectomy  06/2014    [place]  Prior to Admission medications   Medication Sig Start Date End Date Taking? Authorizing Provider  ondansetron (ZOFRAN) 4 MG tablet Take 1 tablet (4 mg total) by mouth every 8 (eight) hours as needed for nausea or vomiting. Patient not taking: Reported on 03/25/2015 01/24/14   EmClayton BiblesPA-C  oxyCODONE-acetaminophen (PERCOCET/ROXICET) 5-325 MG per tablet Take 1-2 tablets by mouth every 4 (four)  hours as needed for moderate pain or severe pain ((Dolor)). Patient not taking: Reported on 03/25/2015 01/24/14   EmClayton BiblesPA-C    Scheduled Meds: . heparin  5,000 Units Subcutaneous 3 times per day   Infusions: . dextrose 5 % and 0.9% NaCl 100 mL/hr at 03/26/15 1130   PRN Meds: morphine injection, ondansetron **OR** ondansetron (ZOFRAN) IV   Allergies as of 03/25/2015  . (No Known Allergies)    History reviewed. No pertinent family history.  Social History   Social History  . Marital Status: Single    Spouse Name: N/A  . Number of Children: N/A  . Years of Education: N/A   Occupational History  . 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   Social History Main Topics  . Smoking status: Never Smoker   . Smokeless tobacco: Not on file  . Alcohol Use: No  . Drug Use: No  . Sexual Activity: Yes    Birth Control/ Protection: None   Other Topics Concern  . Not on file   Social History Narrative    REVIEW OF SYSTEMS: Constitutional:  Has lost a few pounds but no dramatic weight loss. ENT:  No nose bleeds Pulm:   no dyspnea, no cough. CV:  No palpitations, no LE edema.  no  chest pain.  GU:  No hematuria, no frequency Gyn:  Occasionally has heavy periods but generally does not have menorrhagia. Periods are regular. No previous pregnancies.  GI:  Per HPI Heme:   denies excessive bleeding or bruising. Has never been told she had anemia.  Transfusions:   none ever. Neuro:  No headaches, no peripheral tingling or numbness Derm:  No itching, no rash or sores.  Endocrine:  No sweats or chills.  No polyuria or dysuria Immunization:   did not inquire. Travel:  None beyond local counties in last few months.    PHYSICAL EXAM: Vital signs in last 24 hours: Filed Vitals:   03/26/15 0532 03/26/15 1425  BP: 110/60 110/78  Pulse:  65  Temp:  98.3 F (36.8 C)  Resp:  16   Wt Readings from Last 3 Encounters:  03/25/15 54.9 kg (121 lb 0.5  oz)  03/03/13 61.417 kg (135 lb 6.4 oz)    GenPleasant, well-appearing young woman who is comfortable. Head:   no facial asymmetry, swelling or signs of trauma.  Eyes:   no scleral icterus, no conjunctival pallor. EOMI. Ears:   not hard of hearing.  Nose:   no nasal discharge or congestion Mouth:   clear, moist oral MM. Teeth excellent Neck:   no masses, TMG, JVD. Lungs:   clear to auscultation and percussion. No shortness of breath or cough HeaRRR. No MRG. S1/S2 audible. Abdomen:   soft, slightly tender in the epigastric/RUQ region. No guarding or rebound. No distention. Active bowel sounds. No HSM.Marland Kitchen   Rectal:  Deferred   Musc/Skeletal:   no joint deformity, swelling, erythema. Extremities:  no CCE. Feet are warm with 3+ pedal pulses.  Neurologic:  alert. Oriented 3. No tremor. No limb weakness. Skin:  Olive tone to the skin. I'm not able to appreciate jaundice. Tattoos:  none Nodes:  No cervical or inguinal adenopathy   Psych:  pleasant, calm. In good spirits.  Intake/Output from previous day: 11/21 0701 - 11/22 0700 In: 1000 [I.V.:1000] Out: 750 [Urine:750] Intake/Output this shift: Total I/O In: 0  Out: 550 [Urine:550]  LAB RESULTS:  Recent Labs  03/25/15 1300 03/26/15 0542  WBC 8.4 4.6  HGB 12.2 10.7*  HCT 38.2 32.5*  PLT 292 242   BMET Lab Results  Component Value Date   NA 138 03/26/2015   NA 137 03/25/2015   NA 140 01/24/2014   K 4.2 03/26/2015   K 4.1 03/25/2015   K 3.8 01/24/2014   CL 109 03/26/2015   CL 103 03/25/2015   CL 102 01/24/2014   CO2 25 03/26/2015   CO2 25 03/25/2015   CO2 26 01/24/2014   GLUCOSE 123* 03/26/2015   GLUCOSE 114* 03/25/2015   GLUCOSE 103* 01/24/2014   BUN <5* 03/26/2015   BUN 12 03/25/2015   BUN 9 01/24/2014   CREATININE 0.60 03/26/2015   CREATININE 0.62 03/25/2015   CREATININE 0.57 01/24/2014   CALCIUM 8.4* 03/26/2015   CALCIUM 9.8 03/25/2015   CALCIUM 9.4 01/24/2014   LFT  Recent Labs  03/25/15 1300  03/26/15 0542  PROT 8.1 5.8*  ALBUMIN 4.2 3.0*  AST 502* 563*  ALT 373* 673*  ALKPHOS 195* 177*  BILITOT 0.9 1.4*   PT/INR No results found for: INR, PROTIME Hepatitis Panel  Recent Labs  03/25/15 1645  HEPBSAG Negative  HCVAB 0.2  HEPAIGM Negative  HEPBIGM Negative   C-Diff No components found for: CDIFF Lipase     Component Value Date/Time  LIPASE 25 03/25/2015 1300    Drugs of Abuse  No results found for: LABOPIA, COCAINSCRNUR, LABBENZ, AMPHETMU, THCU, LABBARB   RADIOLOGY STUDIES: US Abdomen Limited  03/25/2015  CLINICAL DATA:  24 year old female with elevated LFTs status post cholecystectomy EXAM: US ABDOMEN LIMITED - RIGHT UPPER QUADRANT COMPARISON:  Prior abdominal ultrasound 01/24/2014 FINDINGS: Gallbladder: Surgically absent. Common bile duct: Diameter: Dilated at 11 mm. Additionally, there is and echogenic structure with posterior acoustic shadowing in the distal common duct measuring 11 x 5 mm. Liver: No focal lesion identified. Within normal limits in parenchymal echogenicity. Mild intrahepatic biliary ductal dilatation. IMPRESSION: Choledocholithiasis with dilatation of the common bile duct to 11 mm and perhaps mild central intrahepatic biliary ductal dilatation. The distal common bile duct stone measures approximately 11 x 5 mm. Electronically Signed   By: Jacqulynn Cadet M.D.   On: 03/25/2015 18:07    ENDOSCOPIC STUDIES none  IMPRESSION:   *  Choledocholilthiasis  *  S/p ERCP in [place].   *     normocytic anemia following IV fluid hydration.     PLAN:     *  ERCP arranged for 11:30 tomorrow with [facility]. Risks of bleeding, pancreatitis, intestinal or biliary tract perforation, inability to remove stone and possible need for stent placement discussed with patient and her father using Internet-based anatomy diagrams. She is agreeable to proceed.   Azucena Freed  03/26/2015, 2:49 PM Pager: [phone]  GI ATTENDING  History,labs,x-rays, prior ER  encounter reviewed. Patient seen and examined. agrre with consultation note as outlined above. She has symptomatic choledocholithiasis. Could have been addressed in September, but she cancelled arranged EUS +/-ERCP. Plan for ERCP tomorrow. The nature of the procedure, as well as the risks, benefits, and alternatives were carefully and thoroughly reviewed with the patient. Ample time for discussion and questions allowed. The patient understood, was satisfied, and agreed to proceed.   Docia Chuck. Geri Seminole., M.D. [facility] Division of Gastroenterology

## 2015-03-26 NOTE — Progress Notes (Signed)
Family Medicine Teaching Service Daily Progress Note Intern Pager: [phone]  Patient name: Isabella Archer Medical record number: [id number] Date of birth: 07/17/1990 Age: 24 y.o. Gender: female  Primary Care Provider: No PCP Per Patient Consultants: Gastroenterology Code Status: Full  Pt Overview and Major Events to Date:  03/25/15 - distal common bile duct stone seen on abdominal U/S  Assessment and Plan: Isabella Archer is a 24 y.o. female presenting with abdominal pain, nausea, and vomiting 3 months. PMH is significant for s/p cholecystectomy on 06/2014 (in [place]   Abdominal pain: Consistent with choledocholithiasis, confirmed with abdominal U/S with CBD dilated to 11 mm. RUQ pain (worse w/ greasy food) and elevated AST/ALT 502/373, alkaline phosphatase 195. Lipase wnl. Signs and symptoms consistent with cholecystitis/choledocholithiasis. No current indications of cholangitis (fever) at this time. Pregnancy test negative.  - Gastroenterology consulted, will see patient morning of 11/22.  - Was made NPO after midnight - Hepatitis panel: negative for Hep B, Hep C - Morphine 2-4 mg IV every 2 hours when necessary for pain - Zofran when necessary for nausea - D5/NS at 100 mL/hr - Glucose elevated to 114/123. Likely stress response.  - Bilirubin increased from 0.9 to 1.4. - Anticipate need for ERCP.   Hematemesis: Likely secondary to Mallory-Weiss tears from patient's persistent nausea and vomiting. - H/H down to 10.7/32.5 from 12.2/38.2 on admission but may be hemodilution.  - Protonix 40mg  IV >> transition to PO once no longer NPO  Hematochezia, subjective: Patient reported at least 2 episodes of hematochezia 2 weeks ago. Patient currently denies any symptoms more recently. - FOBT to be collected by RN  FEN/GI: D5/NS @100ml /hr, nothing by mouth after midnight Prophylaxis: Subcutaneous heparin  Disposition: Pending medical improvement and GI  recommendations for or against ERCP  Subjective:  Patient's pain well controlled on morphine. She rates it at a 2 out of 10. She denies nausea at present. She has not had any vomiting.  Objective: Temp:  [97.9 F (36.6 C)-99 F (37.2 C)] 97.9 F (36.6 C) (11/22 0521) Pulse Rate:  [55-84] 55 (11/22 0521) Resp:  [16-20] 16 (11/22 0521) BP: (99-121)/(50-76) 110/60 mmHg (11/22 0532) SpO2:  [96 %-100 %] 99 % (11/22 0521) Weight:  [119 lb 5 oz (54.12 kg)-121 lb 0.5 oz (54.9 kg)] 121 lb 0.5 oz (54.9 kg) (11/21 2020) Physical Exam: General: Well-appearing female resting in bed in NAD, father at bedside Cardiovascular: RRR, S1, S2, no m/r/g Respiratory: CTAB Abdomen: positive Murphy's sign, RUQ point tenderness Extremities: FROM  Laboratory:  Recent Labs Lab 03/25/15 1300 03/26/15 0542  WBC 8.4 4.6  HGB 12.2 10.7*  HCT 38.2 32.5*  PLT 292 242    Recent Labs Lab 03/25/15 1300 03/26/15 0542  NA 137 138  K 4.1 4.2  CL 103 109  CO2 25 25  BUN 12 <5*  CREATININE 0.62 0.60  CALCIUM 9.8 8.4*  PROT 8.1 5.8*  BILITOT 0.9 1.4*  ALKPHOS 195* 177*  ALT 373* 673*  AST 502* 563*  GLUCOSE 114* 123*   Beta hCG <5.0 on 03/25/15.  UA positive for ketones 03/25/15.   Imaging/Diagnostic Tests: Kore[place] Abdomen Limited  03/25/2015  CLINICAL DATA:  24 year old female with elevated LFTs status post cholecystectomy EXAM: US ABDOMEN LIMITED - RIGHT UPPER QUADRANT COMPARISON:  Prior abdominal ultrasound 01/24/2014 FINDINGS: Gallbladder: Surgically absent. Common bile duct: Diameter: Dilated at 11 mm. Additionally, there is and echogenic structure with posterior acoustic shadowing in the distal common duct measuring 11 x 5  mm. Liver: No focal lesion identified. Within normal limits in parenchymal echogenicity. Mild intrahepatic biliary ductal dilatation. IMPRESSION: Choledocholithiasis with dilatation of the common bile duct to 11 mm and perhaps mild central intrahepatic biliary ductal dilatation.  The distal common bile duct stone measures approximately 11 x 5 mm. Electronically Signed   By: Malachy Moan M.D.   On: 03/25/2015 18:07    Casey Burkitt, MD 03/26/2015, 6:57 AM PGY-1, [facility] Family Medicine FPTS Intern pager: [phone], text pages welcome

## 2015-03-27 ENCOUNTER — Encounter (HOSPITAL_COMMUNITY): Admission: EM | Disposition: A | Discharge status: Home / Self Care | Payer: Self-pay | Source: Home / Self Care

## 2015-03-27 ENCOUNTER — Inpatient Hospital Stay (HOSPITAL_COMMUNITY): Payer: Self-pay | Admitting: Anesthesiology

## 2015-03-27 ENCOUNTER — Inpatient Hospital Stay (HOSPITAL_COMMUNITY): Payer: MEDICAID | Admitting: Anesthesiology

## 2015-03-27 ENCOUNTER — Inpatient Hospital Stay (HOSPITAL_COMMUNITY): Payer: Self-pay

## 2015-03-27 ENCOUNTER — Encounter (HOSPITAL_COMMUNITY): Payer: Self-pay | Admitting: *Deleted

## 2015-03-27 DIAGNOSIS — K8050 Calculus of bile duct without cholangitis or cholecystitis without obstruction: Secondary | ICD-10-CM | POA: Insufficient documentation

## 2015-03-27 HISTORY — PX: ERCP: SHX5425

## 2015-03-27 LAB — COMPREHENSIVE METABOLIC PANEL
ALBUMIN: 3.1 g/dL — AB (ref 3.5–5.0)
ALK PHOS: 184 U/L — AB (ref 38–126)
ALT: 512 U/L — ABNORMAL HIGH (ref 14–54)
ANION GAP: 6 (ref 5–15)
AST: 220 U/L — ABNORMAL HIGH (ref 15–41)
BILIRUBIN TOTAL: 0.6 mg/dL (ref 0.3–1.2)
CALCIUM: 8.6 mg/dL — AB (ref 8.9–10.3)
CO2: 26 mmol/L (ref 22–32)
Chloride: 108 mmol/L (ref 101–111)
Creatinine, Ser: 0.49 mg/dL (ref 0.44–1.00)
GFR calc Af Amer: >60 mL/min (ref >60)
GFR calc non Af Amer: >60 mL/min (ref >60)
GLUCOSE: 110 mg/dL — AB (ref 65–99)
Potassium: 3.6 mmol/L (ref 3.5–5.1)
Sodium: 140 mmol/L (ref 135–145)
TOTAL PROTEIN: 5.8 g/dL — AB (ref 6.5–8.1)

## 2015-03-27 LAB — CBC
HEMATOCRIT: 34 % — AB (ref 36.0–46.0)
HEMOGLOBIN: 11 g/dL — AB (ref 12.0–15.0)
MCH: 28.9 pg (ref 26.0–34.0)
MCHC: 32.4 g/dL (ref 30.0–36.0)
MCV: 89.5 fL (ref 78.0–100.0)
Platelets: 261 10*3/uL (ref 150–400)
RBC: 3.8 MIL/uL — AB (ref 3.87–5.11)
RDW: 13.6 % (ref 11.5–15.5)
WBC: 5.9 10*3/uL (ref 4.0–10.5)

## 2015-03-27 SURGERY — ERCP, WITH INTERVENTION IF INDICATED
Anesthesia: General

## 2015-03-27 MED ORDER — SUCCINYLCHOLINE CHLORIDE 20 MG/ML IJ SOLN
INTRAMUSCULAR | Status: DC | PRN
  Administered 2015-03-27: 100 mg via INTRAVENOUS

## 2015-03-27 MED ORDER — FENTANYL CITRATE (PF) 100 MCG/2ML IJ SOLN
INTRAMUSCULAR | Status: DC | PRN
  Administered 2015-03-27: 50 ug via INTRAVENOUS

## 2015-03-27 MED ORDER — MIDAZOLAM HCL 5 MG/5ML IJ SOLN
INTRAMUSCULAR | Status: DC | PRN
  Administered 2015-03-27: 2 mg via INTRAVENOUS

## 2015-03-27 MED ORDER — GLUCAGON HCL RDNA (DIAGNOSTIC) 1 MG IJ SOLR
INTRAMUSCULAR | Status: DC | PRN
  Administered 2015-03-27: .5 mg via INTRAVENOUS

## 2015-03-27 MED ORDER — PROMETHAZINE HCL 25 MG/ML IJ SOLN: 6.2500 mg | INTRAMUSCULAR | Status: DC | PRN

## 2015-03-27 MED ORDER — LIDOCAINE HCL (CARDIAC) 20 MG/ML IV SOLN
INTRAVENOUS | Status: DC | PRN
  Administered 2015-03-27: 50 mg via INTRAVENOUS

## 2015-03-27 MED ORDER — INDOMETHACIN 50 MG RE SUPP
100.0000 mg | Freq: Once | RECTAL | Status: AC
  Administered 2015-03-27: 100 mg via RECTAL
  Filled 2015-03-27: qty 2

## 2015-03-27 MED ORDER — IOHEXOL 350 MG/ML SOLN
INTRAVENOUS | Status: DC | PRN
  Administered 2015-03-27: 40 mL

## 2015-03-27 MED ORDER — SODIUM CHLORIDE 0.9 % IV SOLN: INTRAVENOUS | Status: DC

## 2015-03-27 MED ORDER — ONDANSETRON HCL 4 MG PO TABS: 4.0000 mg | ORAL_TABLET | Freq: Three times a day (TID) | ORAL | Status: AC | PRN

## 2015-03-27 MED ORDER — MEPERIDINE HCL 25 MG/ML IJ SOLN: 6.2500 mg | INTRAMUSCULAR | Status: DC | PRN

## 2015-03-27 MED ORDER — AMPICILLIN-SULBACTAM SODIUM 1.5 (1-0.5) G IJ SOLR
1.5000 g | INTRAMUSCULAR | Status: AC
  Administered 2015-03-27: 1.5 g via INTRAVENOUS
  Filled 2015-03-27: qty 1.5

## 2015-03-27 MED ORDER — LACTATED RINGERS IV SOLN
INTRAVENOUS | Status: DC
  Administered 2015-03-27: 1000 mL via INTRAVENOUS
  Administered 2015-03-27: 10:00:00 via INTRAVENOUS

## 2015-03-27 MED ORDER — PHENOL 1.4 % MT LIQD: 1.0000 | OROMUCOSAL | Status: AC | PRN

## 2015-03-27 MED ORDER — PHENOL 1.4 % MT LIQD
1.0000 | OROMUCOSAL | Status: DC | PRN
  Administered 2015-03-27: 1 via OROMUCOSAL
  Filled 2015-03-27: qty 177

## 2015-03-27 MED ORDER — PROPOFOL 10 MG/ML IV BOLUS
INTRAVENOUS | Status: DC | PRN
  Administered 2015-03-27: 130 mg via INTRAVENOUS

## 2015-03-27 MED ORDER — LACTATED RINGERS IV SOLN: INTRAVENOUS | Status: DC

## 2015-03-27 MED ORDER — FENTANYL CITRATE (PF) 100 MCG/2ML IJ SOLN: 25.0000 ug | INTRAMUSCULAR | Status: DC | PRN

## 2015-03-27 NOTE — Discharge Instructions (Signed)
Colangiopancreatografa retrgrada endoscpica (CPRE) Cuidados posteriores (Endoscopic Retrograde Cholangiopancreatography (ERCP), Care After) Siga estas instrucciones durante las prximas semanas. Estas indicaciones le proporcionan informacin general acerca de cmo deber cuidarse despus del procedimiento. El mdico tambin podr darle instrucciones ms especficas. El tratamiento se ha planificado de acuerdo a las prcticas mdicas actuales, pero a veces se producen problemas. Comunquese con el mdico si tiene algn problema o tiene dudas despus del procedimiento.  QU ESPERAR DESPUS DEL PROCEDIMIENTO  Despus del procedimiento, es tpico tener las siguientes sensaciones:   Dance movement psychotherapistDolor en la garganta.  Ganas de vomitar (nuseas).  Hinchazn.  Mareos.  Cansancio. INSTRUCCIONES PARA EL CUIDADO EN EL HOGAR  Pdale a algn amigo o familiar que se quede con usted durante las primeras 24 horas despus del procedimiento.  Comience a tomar sus medicamentos habituales y a comer normalmente tan pronto como se sienta lo suficientemente bien, o segn las indicaciones de su mdico. SOLICITE ATENCIN MDICA SI:  Siente dolor abdominal.  Tiene signos de infeccin como:  Escalofros.  No se siente bien. SOLICITE ATENCIN MDICA DE INMEDIATO SI:  Tiene dificultad para tragar.  Siente un dolor abdominal, en la garganta o en el pecho cada vez ms intenso.  Vomita.  La materia fecal es negra o tiene [place].  Tiene fiebre.   Esta informacin no tiene Theme park managercomo fin reemplazar el consejo del mdico. Asegrese de hacerle al mdico cualquier pregunta que tenga.   Document Released: 02/08/2013 Elsevier Interactive Patient Education [employer]2016 Elsevier Inc.

## 2015-03-27 NOTE — Op Note (Signed)
[facility] [address] [place], [id number]   ERCP PROCEDURE REPORT  PATIENT: Isabella Archer, Isabella Archer  MR#: [id number] BIRTHDATE: 08-27-1990  GENDER: female  ENDOSCOPIST: Roxy CedarJohn N Latrelle Fuston Jr, MD REFERRED BY: [facility]  PROCEDURE DATE:  03/27/2015 PROCEDURE:   ERCP with sphincterotomy/papillotomy and ERCP with removal of calculus/calculi INDICATIONS:suspected or rule out bile duct stones.. Status post cholecystectomy. With recurrent abdominal pain, elevated liver tests, and dilated duct with common duct stone on ultrasound  MEDICATIONS: Per Anesthesia TOPICAL ANESTHETIC:   none  DESCRIPTION OF PROCEDURE:     Physical exam was performed.  Informed consent was obtained from the patient after explaining the benefits, risks, and alternatives to the procedure.  The patient was connected to the monitor and placed in the semi-prone position. IV medicine was administered through an indwelling cannula and oxygen via endotracheal tube.  After administration of sedation, the patients esophagus was intubated and the Pentax ERCP [id number] endoscope was advanced under direct visualization to the second portion of the duodenum. Marland Kitchen.  EXAM:The side-viewing endoscope was passed blindly into the esophagus.  Limited views of the stomach and duodenum were unremarkable.  The major ampulla was normal.  The minor ampulla was not sought.  A scout radiograph of the abdomen with the endoscope position revealed previous cholecystectomy clips.  The common bile duct was selectively deeply cannulated with the sphincterotome on the first pass.  There was no manipulation of the pancreatic duct. Injection of contrast yielded slightly dilated duct with 8 mm filling defect.  With a hydrophilic guidewire in position, a biliary sphincterotomy was made using the ERBE system cutting in the 12:00 orientation.  Sphincterotomy was deemed moderate sized. Sequential balloon was used to  extract a light-colored stone. Subsequent sweeps of the bile duct were negative with excellent drainage.    The scope was then completely withdrawn from the patient and the procedure terminated.    COMPLICATIONS:    None  ENDOSCOPIC IMPRESSION: 1. Choledocholithiasis status post ERC with biliary sphincterotomy and common duct stone extraction 2. Status post previous cholecystectomy  RECOMMENDATIONS: 1. Standard post ERCP Observation 2. Indomethacin suppository and hydration provided 3. Monitor clinically and liver tests. Advancement of diet as tolerated. Anticipate discharge later today or in a.m. if doing well  Discussed with patient and her father.. A copy of the report has been provided    _______________________________ eSigned:  Roxy CedarJohn N Kai Railsback Jr, MD 03/27/2015 10:57 AM

## 2015-03-27 NOTE — Transfer of Care (Signed)
Immediate Anesthesia Transfer of Care Note  Patient: Isabella Elmyra RicksEunise Bowen Castro  Procedure(s) Performed: Procedure(s): ENDOSCOPIC RETROGRADE CHOLANGIOPANCREATOGRAPHY (ERCP) (N/A)  Patient Location: PACU  Anesthesia Type:General  Level of Consciousness: awake, alert , oriented and sedated  Airway & Oxygen Therapy: Patient Spontanous Breathing and Patient connected to nasal cannula oxygen  Post-op Assessment: Report given to RN, Post -op Vital signs reviewed and stable and Patient moving all extremities  Post vital signs: Reviewed and stable  Last Vitals:  Filed Vitals:   03/27/15 0529 03/27/15 0904  BP: 96/52 108/47  Pulse: 56 63  Temp: 36.7 C 36.8 C  Resp: 18 18    Complications: No apparent anesthesia complications

## 2015-03-27 NOTE — Progress Notes (Signed)
Isabella Archer discharged Home with family per MD order.  Discharge instructions reviewed and discussed with the patient, all questions and concerns answered. Copy of instructions, care notes for diagnosis and  given to patient.    Medication List    STOP taking these medications        oxyCODONE-acetaminophen 5-325 MG tablet  Commonly known as:  PERCOCET/ROXICET      TAKE these medications        ondansetron 4 MG tablet  Commonly known as:  ZOFRAN  Take 1 tablet (4 mg total) by mouth every 8 (eight) hours as needed for nausea or vomiting.     phenol 1.4 % Liqd  Commonly known as:  CHLORASEPTIC  Use as directed 1 spray in the mouth or throat as needed for throat irritation / pain.        Patients skin is clean, dry and intact, no evidence of skin break down. IV site discontinued and catheter remains intact. Site without signs and symptoms of complications. Dressing and pressure applied.  Patient escorted to car by NT in a wheelchair,  no distress noted upon discharge.  Isabella Archer, Isabella Archer 03/27/2015 7:39 PM

## 2015-03-27 NOTE — H&P (View-Only) (Signed)
[facility] Consult: 2:49 PM 03/26/2015  LOS: 1 day    Referring Provider: Dr. Mingo Amber  Primary Care Physician:  No PCP Per Patient Primary Gastroenterologist:  unassigned     Reason for Consultation:  Choledocholithiasis   HPI: Isabella Archer is a 24 y.o. female.  Generally healthy young woman. Diagnosed with cholelithiasis and 30m CBD by ultrasound in 01/2014. LFTs elevated then: AST/ALT 359/233, alk phos 145.  Normal T bili.   An ERCP/EUS was arranged but pt cancelled the procedure as she had tickets to travel to [place] there, in 06/2014, patient underwent uneventful laparoscopic cholecystectomy.  For a few months she was GI symptom free. However she subsequently had recurrent symptoms of upper abdominal pain, nausea and vomiting. These have become much worse in the last week. They are precipitated by greasy food..  Seen in ED yesterday.  Admitted to teaching service.  AST/ALT 563/637, alk phos 177, T bili 1.4. Ultrasound with 163mCBD, choledocholithiasis.   Family history positive for cholecystectomy in her mother and a maternal aunt. Patient doesn't drink alcohol.    Past Medical History  Diagnosis Date  . Cholelithiasis 01/2014.  . Dilated cbd, acquired 01/2014.    Past Surgical History  Procedure Laterality Date  . Laparoscopic cholecystectomy  06/2014    [place]  Prior to Admission medications   Medication Sig Start Date End Date Taking? Authorizing Provider  ondansetron (ZOFRAN) 4 MG tablet Take 1 tablet (4 mg total) by mouth every 8 (eight) hours as needed for nausea or vomiting. Patient not taking: Reported on 03/25/2015 01/24/14   EmClayton BiblesPA-C  oxyCODONE-acetaminophen (PERCOCET/ROXICET) 5-325 MG per tablet Take 1-2 tablets by mouth every 4 (four)  hours as needed for moderate pain or severe pain ((Dolor)). Patient not taking: Reported on 03/25/2015 01/24/14   EmClayton BiblesPA-C    Scheduled Meds: . heparin  5,000 Units Subcutaneous 3 times per day   Infusions: . dextrose 5 % and 0.9% NaCl 100 mL/hr at 03/26/15 1130   PRN Meds: morphine injection, ondansetron **OR** ondansetron (ZOFRAN) IV   Allergies as of 03/25/2015  . (No Known Allergies)    History reviewed. No pertinent family history.  Social History   Social History  . Marital Status: Single    Spouse Name: N/A  . Number of Children: N/A  . Years of Education: N/A   Occupational History  . 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   Social History Main Topics  . Smoking status: Never Smoker   . Smokeless tobacco: Not on file  . Alcohol Use: No  . Drug Use: No  . Sexual Activity: Yes    Birth Control/ Protection: None   Other Topics Concern  . Not on file   Social History Narrative    REVIEW OF SYSTEMS: Constitutional:  Has lost a few pounds but no dramatic weight loss. ENT:  No nose bleeds Pulm:   no dyspnea, no cough. CV:  No palpitations, no LE edema.  no  chest pain.  GU:  No hematuria, no frequency Gyn:  Occasionally has heavy periods but generally does not have menorrhagia. Periods are regular. No previous pregnancies.  GI:  Per HPI Heme:   denies excessive bleeding or bruising. Has never been told she had anemia.  Transfusions:   none ever. Neuro:  No headaches, no peripheral tingling or numbness Derm:  No itching, no rash or sores.  Endocrine:  No sweats or chills.  No polyuria or dysuria Immunization:   did not inquire. Travel:  None beyond local counties in last few months.    PHYSICAL EXAM: Vital signs in last 24 hours: Filed Vitals:   03/26/15 0532 03/26/15 1425  BP: 110/60 110/78  Pulse:  65  Temp:  98.3 F (36.8 C)  Resp:  16   Wt Readings from Last 3 Encounters:  03/25/15 54.9 kg (121 lb 0.5  oz)  03/03/13 61.417 kg (135 lb 6.4 oz)    GenPleasant, well-appearing young woman who is comfortable. Head:   no facial asymmetry, swelling or signs of trauma.  Eyes:   no scleral icterus, no conjunctival pallor. EOMI. Ears:   not hard of hearing.  Nose:   no nasal discharge or congestion Mouth:   clear, moist oral MM. Teeth excellent Neck:   no masses, TMG, JVD. Lungs:   clear to auscultation and percussion. No shortness of breath or cough HeaRRR. No MRG. S1/S2 audible. Abdomen:   soft, slightly tender in the epigastric/RUQ region. No guarding or rebound. No distention. Active bowel sounds. No HSM.Marland Kitchen   Rectal:  Deferred   Musc/Skeletal:   no joint deformity, swelling, erythema. Extremities:  no CCE. Feet are warm with 3+ pedal pulses.  Neurologic:  alert. Oriented 3. No tremor. No limb weakness. Skin:  Olive tone to the skin. I'm not able to appreciate jaundice. Tattoos:  none Nodes:  No cervical or inguinal adenopathy   Psych:  pleasant, calm. In good spirits.  Intake/Output from previous day: 11/21 0701 - 11/22 0700 In: 1000 [I.V.:1000] Out: 750 [Urine:750] Intake/Output this shift: Total I/O In: 0  Out: 550 [Urine:550]  LAB RESULTS:  Recent Labs  03/25/15 1300 03/26/15 0542  WBC 8.4 4.6  HGB 12.2 10.7*  HCT 38.2 32.5*  PLT 292 242   BMET Lab Results  Component Value Date   NA 138 03/26/2015   NA 137 03/25/2015   NA 140 01/24/2014   K 4.2 03/26/2015   K 4.1 03/25/2015   K 3.8 01/24/2014   CL 109 03/26/2015   CL 103 03/25/2015   CL 102 01/24/2014   CO2 25 03/26/2015   CO2 25 03/25/2015   CO2 26 01/24/2014   GLUCOSE 123* 03/26/2015   GLUCOSE 114* 03/25/2015   GLUCOSE 103* 01/24/2014   BUN <5* 03/26/2015   BUN 12 03/25/2015   BUN 9 01/24/2014   CREATININE 0.60 03/26/2015   CREATININE 0.62 03/25/2015   CREATININE 0.57 01/24/2014   CALCIUM 8.4* 03/26/2015   CALCIUM 9.8 03/25/2015   CALCIUM 9.4 01/24/2014   LFT  Recent Labs  03/25/15 1300  03/26/15 0542  PROT 8.1 5.8*  ALBUMIN 4.2 3.0*  AST 502* 563*  ALT 373* 673*  ALKPHOS 195* 177*  BILITOT 0.9 1.4*   PT/INR No results found for: INR, PROTIME Hepatitis Panel  Recent Labs  03/25/15 1645  HEPBSAG Negative  HCVAB 0.2  HEPAIGM Negative  HEPBIGM Negative   C-Diff No components found for: CDIFF Lipase     Component Value Date/Time  LIPASE 25 03/25/2015 1300    Drugs of Abuse  No results found for: LABOPIA, COCAINSCRNUR, LABBENZ, AMPHETMU, THCU, LABBARB   RADIOLOGY STUDIES: US Abdomen Limited  03/25/2015  CLINICAL DATA:  24 year old female with elevated LFTs status post cholecystectomy EXAM: US ABDOMEN LIMITED - RIGHT UPPER QUADRANT COMPARISON:  Prior abdominal ultrasound 01/24/2014 FINDINGS: Gallbladder: Surgically absent. Common bile duct: Diameter: Dilated at 11 mm. Additionally, there is and echogenic structure with posterior acoustic shadowing in the distal common duct measuring 11 x 5 mm. Liver: No focal lesion identified. Within normal limits in parenchymal echogenicity. Mild intrahepatic biliary ductal dilatation. IMPRESSION: Choledocholithiasis with dilatation of the common bile duct to 11 mm and perhaps mild central intrahepatic biliary ductal dilatation. The distal common bile duct stone measures approximately 11 x 5 mm. Electronically Signed   By: Jacqulynn Cadet M.D.   On: 03/25/2015 18:07    ENDOSCOPIC STUDIES none  IMPRESSION:   *  Choledocholilthiasis  *  S/p ERCP in [place].   *     normocytic anemia following IV fluid hydration.     PLAN:     *  ERCP arranged for 11:30 tomorrow with MAC. Risks of bleeding, pancreatitis, intestinal or biliary tract perforation, inability to remove stone and possible need for stent placement discussed with patient and her father using Internet-based anatomy diagrams. She is agreeable to proceed.   Isabella Archer  03/26/2015, 2:49 PM Pager: [phone]  GI ATTENDING  History,labs,x-rays, prior ER  encounter reviewed. Patient seen and examined. agrre with consultation note as outlined above. She has symptomatic choledocholithiasis. Could have been addressed in September, but she cancelled arranged EUS +/-ERCP. Plan for ERCP tomorrow. The nature of the procedure, as well as the risks, benefits, and alternatives were carefully and thoroughly reviewed with the patient. Ample time for discussion and questions allowed. The patient understood, was satisfied, and agreed to proceed.   Docia Chuck. Geri Seminole., M.D. [facility] Division of Gastroenterology

## 2015-03-27 NOTE — Anesthesia Postprocedure Evaluation (Signed)
Anesthesia Post Note  Patient: Renai Eunise Bowen Elmyra Ricksastro  Procedure(s) Performed: Procedure(s) (LRB): ENDOSCOPIC RETROGRADE CHOLANGIOPANCREATOGRAPHY (ERCP) (N/A)  Patient location during evaluation: PACU Anesthesia Type: General Level of consciousness: awake and alert Pain management: pain level controlled Vital Signs Assessment: post-procedure vital signs reviewed and stable Respiratory status: spontaneous breathing, nonlabored ventilation, respiratory function stable and patient connected to nasal cannula oxygen Cardiovascular status: blood pressure returned to baseline and stable Postop Assessment: No signs of nausea or vomiting Anesthetic complications: no    Last Vitals:  Filed Vitals:   03/27/15 1110 03/27/15 1120  BP: 116/76 119/74  Pulse: 71 65  Temp:    Resp: 23 30    Last Pain:  Filed Vitals:   03/27/15 1123  PainSc: 3                  Shelton SilvasKevin D Adalei Novell

## 2015-03-27 NOTE — Anesthesia Preprocedure Evaluation (Signed)
Anesthesia Evaluation  Patient identified by MRN, date of birth, ID band Patient awake    Reviewed: Allergy & Precautions, NPO status , Patient's Chart, lab work & pertinent test results  Airway Mallampati: II  TM Distance: >3 FB Neck ROM: Full    Dental  (+) Teeth Intact   Pulmonary neg pulmonary ROS,    breath sounds clear to auscultation       Cardiovascular negative cardio ROS   Rhythm:Regular Rate:Normal     Neuro/Psych negative neurological ROS  negative psych ROS   GI/Hepatic negative GI ROS, Neg liver ROS,   Endo/Other  negative endocrine ROS  Renal/GU negative Renal ROS  negative genitourinary   Musculoskeletal negative musculoskeletal ROS (+)   Abdominal   Peds negative pediatric ROS (+)  Hematology negative hematology ROS (+)   Anesthesia Other Findings   Reproductive/Obstetrics negative OB ROS                             Anesthesia Physical Anesthesia Plan  ASA: II  Anesthesia Plan: General   Post-op Pain Management:    Induction: Intravenous  Airway Management Planned: Oral ETT  Additional Equipment:   Intra-op Plan:   Post-operative Plan:   Informed Consent: I have reviewed the patients History and Physical, chart, labs and discussed the procedure including the risks, benefits and alternatives for the proposed anesthesia with the patient or authorized representative who has indicated his/her understanding and acceptance.   Dental advisory given  Plan Discussed with: CRNA  Anesthesia Plan Comments: (Ms. Maretta BeesBowen Castro states she just finished her menstrual cycle a few days ago and has not been sexually active. )        Anesthesia Quick Evaluation

## 2015-03-27 NOTE — Discharge Summary (Signed)
Family Medicine Teaching [facility] Discharge Summary  Patient name: Isabella Archer Medical record number: [id number] Date of birth: [birth year] Age: 24 y.o. Gender: female Date of Admission: 03/25/2015  Date of Discharge: 03/27/15 Admitting Physician: Tobey Grim, MD  Primary Care Provider: No PCP Per Patient Consultants: GI  Indication for Hospitalization: Choledocholithiasis  Discharge Diagnoses/Problem List:  Active Problems:   Choledocholithiasis   Choledocholithiasis with obstruction   Bilious vomiting with nausea   Abdominal pain, right upper quadrant   Calculus of bile duct with obstruction and without cholangitis or cholecystitis  Disposition: Home  Discharge Condition: Improved  Discharge Exam:  General: Well-appearing female sitting at edge of bed, father at bedside Cardiovascular: RRR, S1, S2, no m/r/g Respiratory: CTAB Abdomen: Abdomen soft, nontender, nondistended Extremities: FROM  Brief Hospital Course:  Isabella Archer is a 24 y.o. female who presented with abdominal pain, nausea, and vomiting 3 months, which had worsened over the last week. PMH is significant for s/p cholecystectomy on 06/2014 (in [place]). Father and patient reported she had been having pain after eating greasy food for over a year. She does not drink alcohol.  In the ED, she was found to have a transaminitis (AST/ALT 502/373). Exam significant for positive Murphy's sign. Alkaline phosphatase was 195. Lipase was negative. Urine pregnancy test was negative. UA was negative for nitrites or leukocytes. Due to patient's history of cholecystectomy, choledocholithiasis was the leading diagnosis, and this was confirmed by abdominal ultrasound, showing the CBD dilated to 11 mm. Patient never spiked a fever or had leukocytosis to raise concern for cholangitis.  Hepatic function panel resulted as negative for Hep B and Hep C.   GI was consulted, and ERCP with biliary  sphincterotomy was performed 03/27/15. Common duct stone was successfully removed. Pain prior to procedure was managed with IV morphine prn. Indomethacin suppository and hydration provided during procedure.   Patient was able to tolerate dinner after procedure and was discharged home that evening. She was complaining of a sore throat s/p ERCP, so chloraseptic spray was recommended.   Issues for Follow Up:  1. Inquire about resolution of abdominal pain, nausea, vomiting.  2. Repeat CMP at follow-up to confirm resolution of transaminitis and elevated alkaline phosphatase.   Significant Procedures: ERCP on 11/23  Significant Labs and Imaging:   Recent Labs Lab 03/25/15 1300 03/26/15 0542 03/27/15 0429  WBC 8.4 4.6 5.9  HGB 12.2 10.7* 11.0*  HCT 38.2 32.5* 34.0*  PLT 292 242 261    Recent Labs Lab 03/25/15 1300 03/26/15 0542 03/27/15 0429  NA 137 138 140  K 4.1 4.2 3.6  CL 103 109 108  CO2 GLUCOSE 114* 123* 110*  BUN 12 <5* <5*  CREATININE 0.62 0.60 0.49  CALCIUM 9.8 8.4* 8.6*  ALKPHOS 195* 177* 184*  AST 502* 563* 220*  ALT 373* 673* 512*  ALBUMIN 4.2 3.0* 3.1*   US Abdomen Limited  03/25/2015  CLINICAL DATA:  24 year old female with elevated LFTs status post cholecystectomy EXAM: US ABDOMEN LIMITED - RIGHT UPPER QUADRANT COMPARISON:  Prior abdominal ultrasound 01/24/2014 FINDINGS: Gallbladder: Surgically absent. Common bile duct: Diameter: Dilated at 11 mm. Additionally, there is and echogenic structure with posterior acoustic shadowing in the distal common duct measuring 11 x 5 mm. Liver: No focal lesion identified. Within normal limits in parenchymal echogenicity. Mild intrahepatic biliary ductal dilatation. IMPRESSION: Choledocholithiasis with dilatation of the common bile duct to 11 mm and perhaps mild central intrahepatic biliary ductal  dilatation. The distal common bile duct stone measures approximately 11 x 5 mm. Electronically Signed   By: Malachy MoanHeath  McCullough  M.D.   On: 03/25/2015 18:07   Dg Ercp Biliary & Pancreatic Ducts  03/27/2015  CLINICAL DATA:  24 year old female with a history of cholelithiasis and choledocholithiasis EXAM: ERCP TECHNIQUE: Multiple spot images obtained with the fluoroscopic device and submitted for interpretation post-procedure. FLUOROSCOPY TIME:  Fluoroscopy Time:  2 minutes 30 seconds COMPARISON:  Ultrasound 03/25/2015 FINDINGS: Initial image demonstrates endoscope projecting over the upper abdomen with surgical changes of prior cholecystectomy. Subsequently there is cannulation of the ampulla with retrograde infusion of contrast and partial opacification of the extrahepatic biliary ducts. Balloon basket is noted in place on image 7, as well as vague rounded filling defects at the confluence of the hepatic ducts. Final image demonstrates resolution of filling defects, with unremarkable caliber of the ductal system. IMPRESSION: Images during ERCP demonstrate unremarkable caliber of the ducts, with no residual filling defects on the final images. Please refer to the dictated operative report for full details of intraoperative findings and procedure. Signed, Yvone NeuJaime S. Loreta AveWagner, DO [facility] These images were submitted for radiologic interpretation only. Please see the procedural report for the amount of contrast and the fluoroscopy time utilized. Electronically Signed   By: Gilmer MorJaime  Wagner D.O.   On: 03/27/2015 12:36   Results/Tests Pending at Time of Discharge: None  Discharge Medications:    Medication List    STOP taking these medications        oxyCODONE-acetaminophen 5-325 MG tablet  Commonly known as:  PERCOCET/ROXICET      TAKE these medications        ondansetron 4 MG tablet  Commonly known as:  ZOFRAN  Take 1 tablet (4 mg total) by mouth every 8 (eight) hours as needed for nausea or vomiting.     phenol 1.4 % Liqd  Commonly known as:  CHLORASEPTIC  Use as  directed 1 spray in the mouth or throat as needed for throat irritation / pain.        Discharge Instructions: Please refer to Patient Instructions section of EMR for full details.  Patient was counseled important signs and symptoms that should prompt return to medical care, changes in medications, dietary instructions, activity restrictions, and follow up appointments.   Follow-Up Appointments:     Follow-up Information    Follow up with [facility] On 04/16/2015.   Specialty:  Internal Medicine   Why:  09:15. 10:45. Please arrive 10 minutes early and bring photo ID. [facility] after [facility]. Your first appointment will be at the [facility] location.   Contact information:   [address] [place] [id number] [phone]      Casey BurkittHillary Moen Jeff Mccallum, MD 03/27/2015, 5:37 PM PGY-1, [facility]

## 2015-03-27 NOTE — Interval H&P Note (Signed)
History and Physical Interval Note:  03/27/2015 10:01 AM  Isabella Archer  has presented today for surgery, with the diagnosis of choledocholithiasis  The various methods of treatment have been discussed with the patient and family. After consideration of risks, benefits and other options for treatment, the patient has consented to  Procedure(s): ENDOSCOPIC RETROGRADE CHOLANGIOPANCREATOGRAPHY (ERCP) (N/A) as a surgical intervention .  The patient's history has been reviewed, patient examined, no change in status, stable for surgery.  I have reviewed the patient's chart and labs.  Questions were answered to the patient's satisfaction.    As above, procedure reviewed in detail with patient. Father at bedside.   Isabella Archer

## 2015-03-27 NOTE — Progress Notes (Signed)
Patient received pamphlet from [facility]. CM explained to patient that they may use the on site pharmacy to fill prescriptions given to them at discharge. Patient aware that the [facility] will not fill narcotics or pain medications prior to the patient being seen by one of their physicians.  Patient aware that they must be seen as a patient prior to the pharmacy filling the prescriptions a second time.  

## 2015-03-27 NOTE — Anesthesia Procedure Notes (Signed)
Procedure Name: Intubation Date/Time: 03/27/2015 10:14 AM Performed by: Fransisca KaufmannMEYER, Siena Poehler E Pre-anesthesia Checklist: Patient identified, Emergency Drugs available, Suction available, Patient being monitored and Timeout performed Patient Re-evaluated:Patient Re-evaluated prior to inductionOxygen Delivery Method: Circle system utilized Preoxygenation: Pre-oxygenation with 100% oxygen Intubation Type: IV induction Ventilation: Mask ventilation without difficulty Laryngoscope Size: Miller and 2 Grade View: Grade I Tube type: Oral Tube size: 7.0 mm Number of attempts: 1 Airway Equipment and Method: Stylet Placement Confirmation: ETT inserted through vocal cords under direct vision,  positive ETCO2,  CO2 detector and breath sounds checked- equal and bilateral Secured at: 21 cm Tube secured with: Tape Dental Injury: Teeth and Oropharynx as per pre-operative assessment  Difficulty Due To: Difficulty was unanticipated

## 2015-03-27 NOTE — Progress Notes (Signed)
Family Medicine Teaching Service Daily Progress Note Intern Pager: [phone]  Patient name: Isabella Archer Medical record number: [id number] Date of birth: 1990/12/15 Age: 24 y.o. Gender: female  Primary Care Provider: No PCP Per Patient Consultants: Gastroenterology Code Status: Full  Pt Overview and Major Events to Date:  03/25/15 - distal common bile duct stone seen on abdominal U/S  Assessment and Plan: Isabella Archer is a 24 y.o. female presenting with abdominal pain, nausea, and vomiting 3 months. PMH is significant for s/p cholecystectomy on 06/2014 (in [place]).   Abdominal pain: Consistent with choledocholithiasis, confirmed with abdominal U/S with CBD dilated to 11 mm. RUQ pain (worse w/ greasy food) and elevated AST/ALT 502/373, alkaline phosphatase 195. Lipase wnl. Signs and symptoms consistent with cholecystitis/choledocholithiasis. No current indications of cholangitis (fever) at this time. Pregnancy test negative.  - ERCP planned for 11/23 at 11:30 am.  - Was made NPO after midnight - Hepatitis panel: negative for Hep B, Hep C - Morphine 2-4 mg IV every 2 hours when necessary for pain (needed once overnight) - Zofran when necessary for nausea - D5/NS at 100 mL/hr - Glucose elevated to 114/123. Likely stress response.  - Bilirubin increased from 0.9 to 1.4.  Hematemesis: Likely secondary to Mallory-Weiss tears from patient's persistent nausea and vomiting. - H/H down to 10.7/32.5 from 12.2/38.2 on admission but may be hemodilution.  - Protonix  IV >> transition to PO once no longer NPO  Hematochezia, subjective: Patient reported at least 2 episodes of hematochezia 2 weeks ago. Patient currently denies any symptoms more recently. - FOBT to be collected by RN  FEN/GI: D5/NS /hr, nothing by mouth after midnight Prophylaxis: Subcutaneous heparin  Disposition: Patient may be discharged home after ERCP as long as she is tolerating PO and  abdominal pain is controlled. Anticipate discharge later 11/23 or 11/24.   Subjective:  Patient's pain controlled on morphine. She has only had juice and water since admission.  Objective: Temp:  [98 F (36.7 C)-98.5 F (36.9 C)] 98 F (36.7 C) (11/23 0529) Pulse Rate:  [56-65] 56 (11/23 0529) Resp:  [16-18] 18 (11/23 0529) BP: (96-110)/(52-78) 96/52 mmHg (11/23 0529) SpO2:  [100 %] 100 % (11/23 0529) Physical Exam: General: Well-appearing female sitting at edge of bed, father at bedside Cardiovascular: RRR, S1, S2, no m/r/g Respiratory: CTAB Abdomen: RUQ point tenderness with guarding Extremities: FROM  Laboratory:  Recent Labs Lab 03/25/15 1300 03/26/15 0542 03/27/15 0429  WBC 8.4 4.6 5.9  HGB 12.2 10.7* 11.0*  HCT 38.2 32.5* 34.0*  PLT 292 242 261    Recent Labs Lab 03/25/15 1300 03/26/15 0542  NA 137 138  K 4.1 4.2  CL 103 109  CO2 25 25  BUN 12 <5*  CREATININE 0.62 0.60  CALCIUM 9.8 8.4*  PROT 8.1 5.8*  BILITOT 0.9 1.4*  ALKPHOS 195* 177*  ALT 373* 673*  AST 502* 563*  GLUCOSE 114* 123*   Beta hCG <5.0 on 03/25/15.  UA positive for ketones 03/25/15.   Imaging/Diagnostic Tests: US Abdomen Limited  03/25/2015  CLINICAL DATA:  24 year old female with elevated LFTs status post cholecystectomy EXAM: US ABDOMEN LIMITED - RIGHT UPPER QUADRANT COMPARISON:  Prior abdominal ultrasound 01/24/2014 FINDINGS: Gallbladder: Surgically absent. Common bile duct: Diameter: Dilated at 11 mm. Additionally, there is and echogenic structure with posterior acoustic shadowing in the distal common duct measuring 11 x 5 mm. Liver: No focal lesion identified. Within normal limits in parenchymal echogenicity. Mild intrahepatic biliary ductal dilatation.  IMPRESSION: Choledocholithiasis with dilatation of the common bile duct to 11 mm and perhaps mild central intrahepatic biliary ductal dilatation. The distal common bile duct stone measures approximately 11 x 5 mm. Electronically  Signed   By: Malachy MoanHeath  McCullough M.D.   On: 03/25/2015 18:07    Casey BurkittHillary Moen Ronelle Michie, MD 03/27/2015, 7:34 AM PGY-1, [facility] FPTS Intern pager: [phone], text pages welcome

## 2015-03-29 ENCOUNTER — Encounter (HOSPITAL_COMMUNITY): Payer: Self-pay | Admitting: Internal Medicine

## 2015-04-01 ENCOUNTER — Encounter: Payer: Self-pay | Admitting: Internal Medicine

## 2015-04-01 ENCOUNTER — Encounter: Payer: Self-pay | Admitting: Family Medicine

## 2015-04-16 ENCOUNTER — Ambulatory Visit: Payer: Self-pay | Admitting: Family Medicine

## 2016-09-22 IMAGING — RF DG ERCP WO/W SPHINCTEROTOMY
1 series · 9 of 9 positions shown · non-contrast
Comparison: Ultrasound 03/25/2015

CLINICAL DATA: 24-year-old female with a history of cholelithiasis
and choledocholithiasis

EXAM:
ERCP
TECHNIQUE: Multiple spot images obtained with the fluoroscopic device and
submitted for interpretation post-procedure.
FLUOROSCOPY TIME:  Fluoroscopy Time:  2 minutes 30 seconds

[Series 1: run · 9 of 9 slices shown]
[im 1/9]
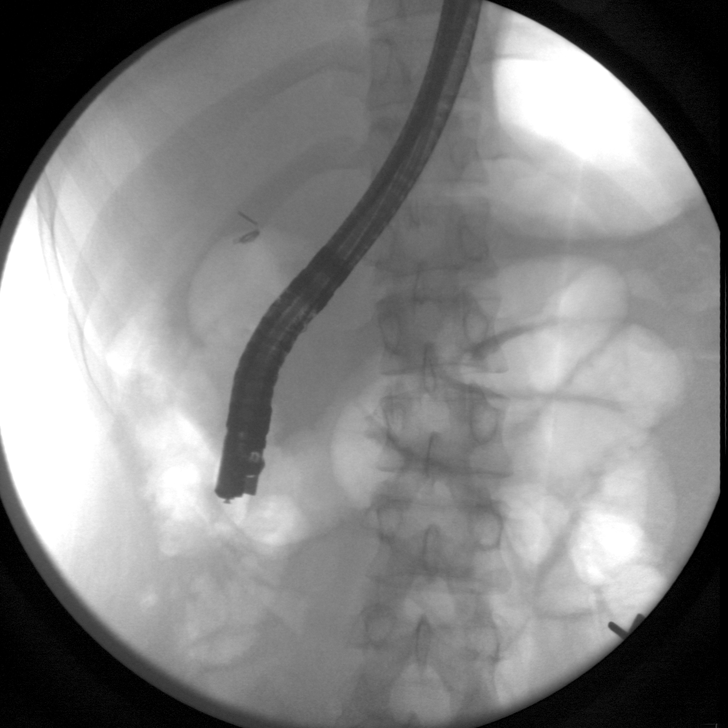
[im 2/9]
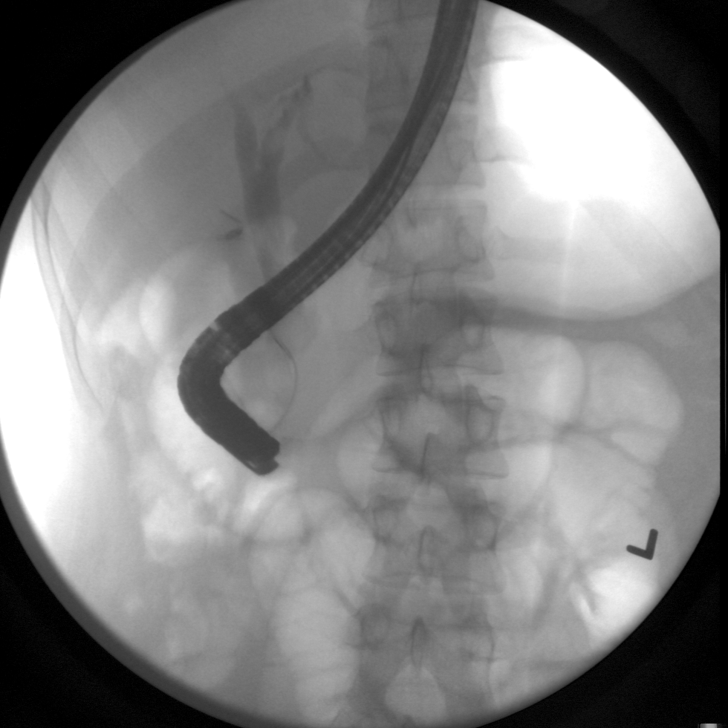
[im 3/9]
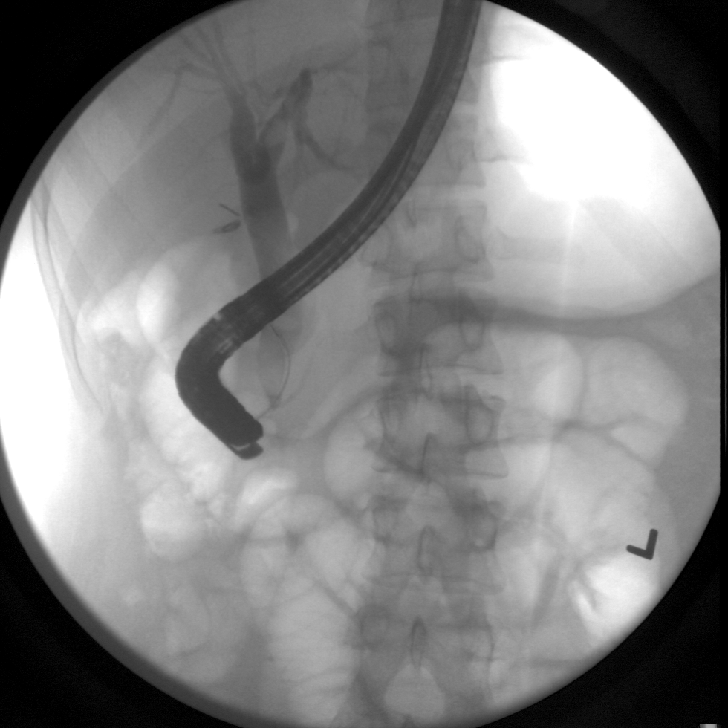
[im 4/9]
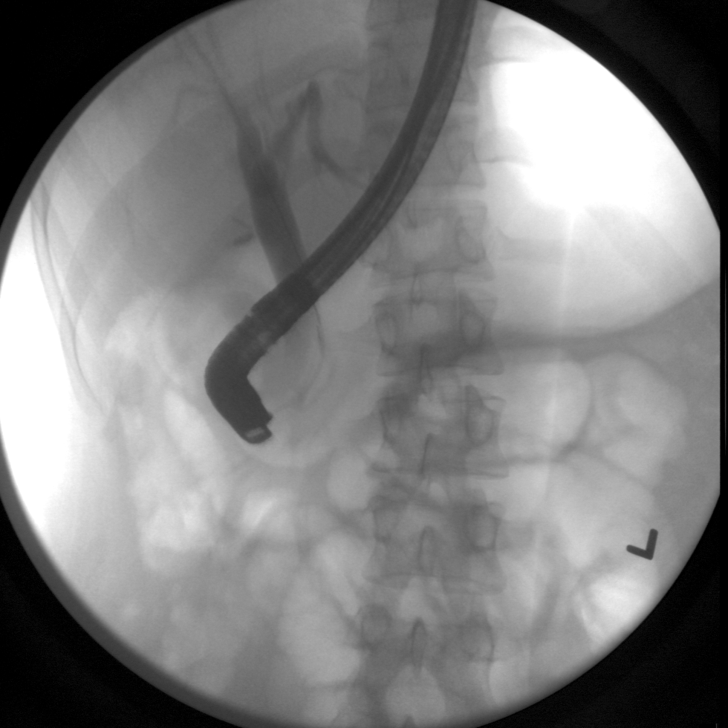
[im 5/9]
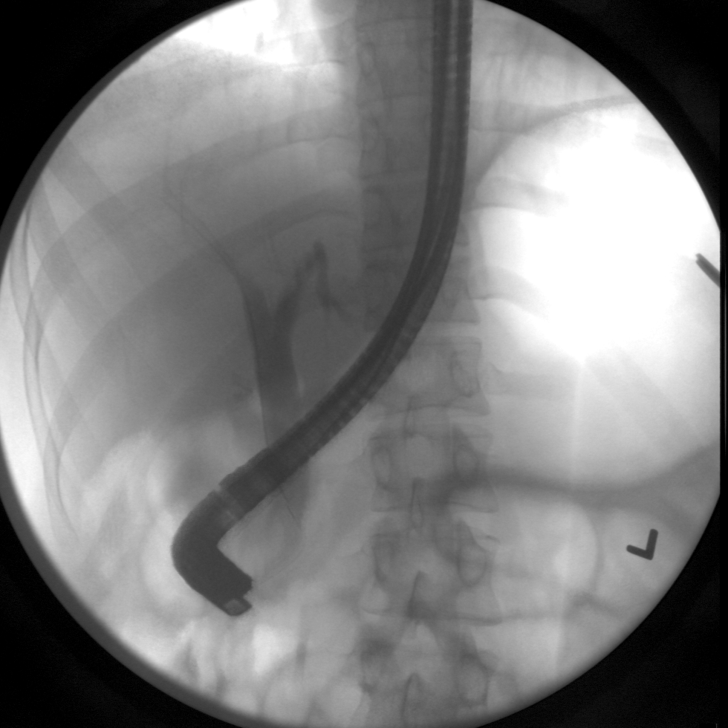
[im 6/9]
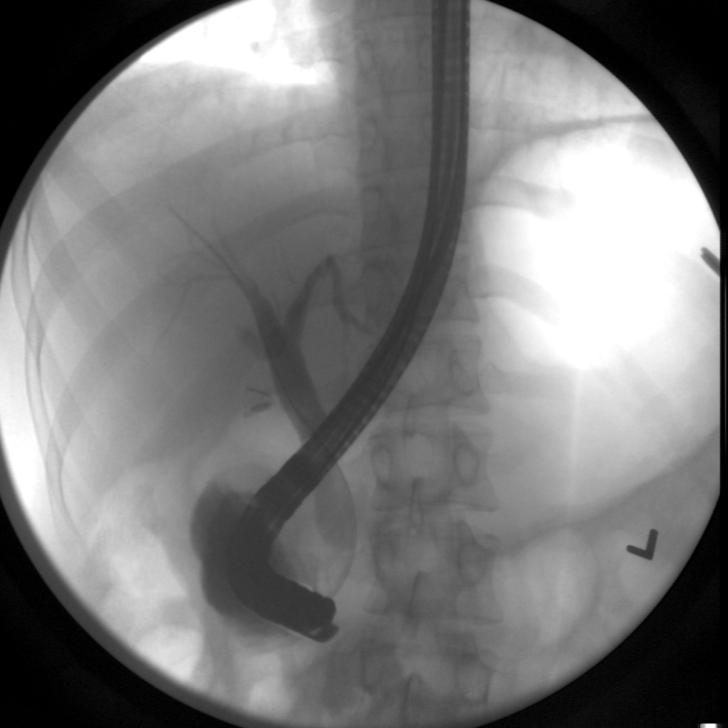
[im 7/9]
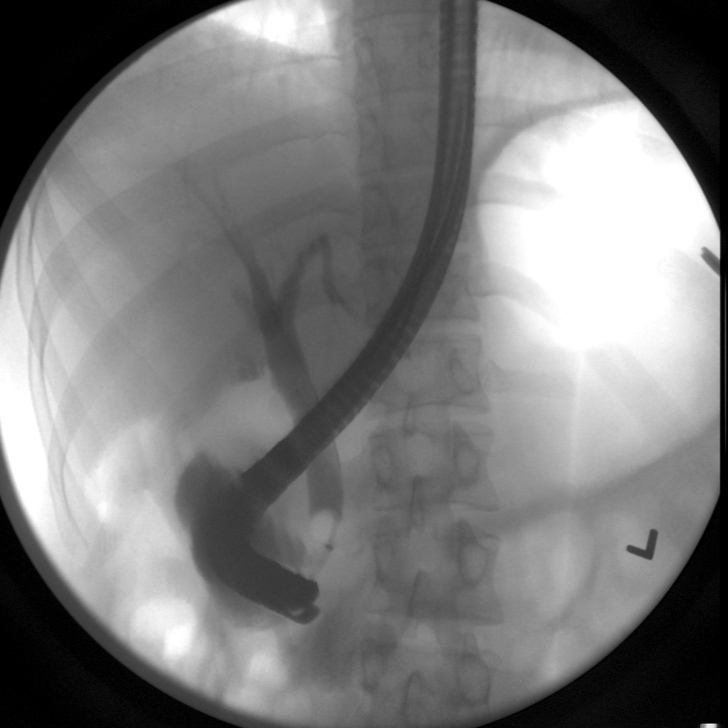
[im 8/9]
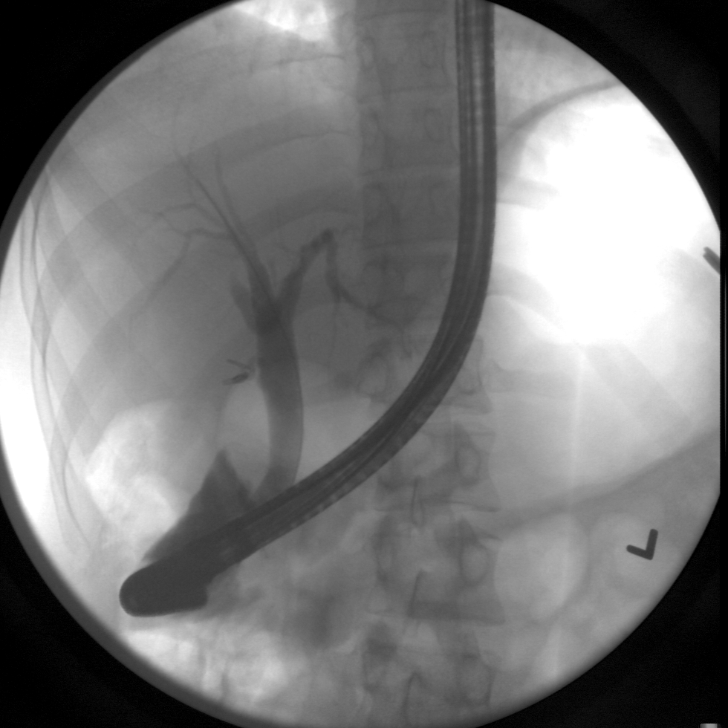
[im 9/9]
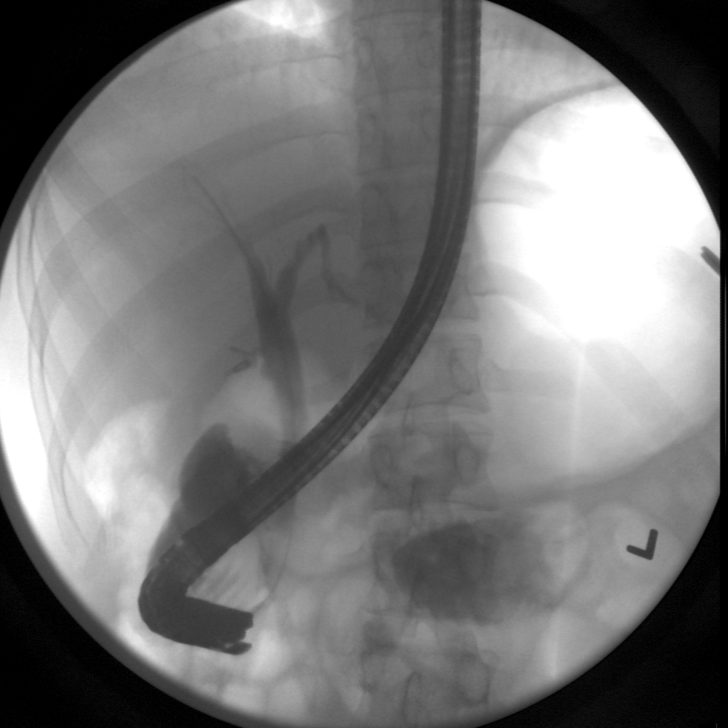

[9 of 9 positions shown; findings below may reference images not displayed]

FINDINGS: Initial image demonstrates endoscope projecting over the upper
abdomen with surgical changes of prior cholecystectomy.

Subsequently there is cannulation of the ampulla with retrograde
infusion of contrast and partial opacification of the extrahepatic
biliary ducts.

Balloon basket is noted in place on image 7, as well as vague
rounded filling defects at the confluence of the hepatic ducts.

Final image demonstrates resolution of filling defects, with
unremarkable caliber of the ductal system.
IMPRESSION: Images during ERCP demonstrate unremarkable caliber of the ducts,
with no residual filling defects on the final images.

Please refer to the dictated operative report for full details of
intraoperative findings and procedure.

These images were submitted for radiologic interpretation only.
Please see the procedural report for the amount of contrast and the
fluoroscopy time utilized.

## 2017-09-26 IMAGING — US US ABDOMEN LIMITED
1 series · 14 of 25 positions shown · non-contrast
Comparison: Prior abdominal ultrasound 01/24/2014

CLINICAL DATA: 24-year-old female with elevated LFTs status post
cholecystectomy

EXAM:
US ABDOMEN LIMITED - RIGHT UPPER QUADRANT

[Series 1: us abdomen limited · 0.17mm/px · 14 of 31 slices shown]
[im 1/31]
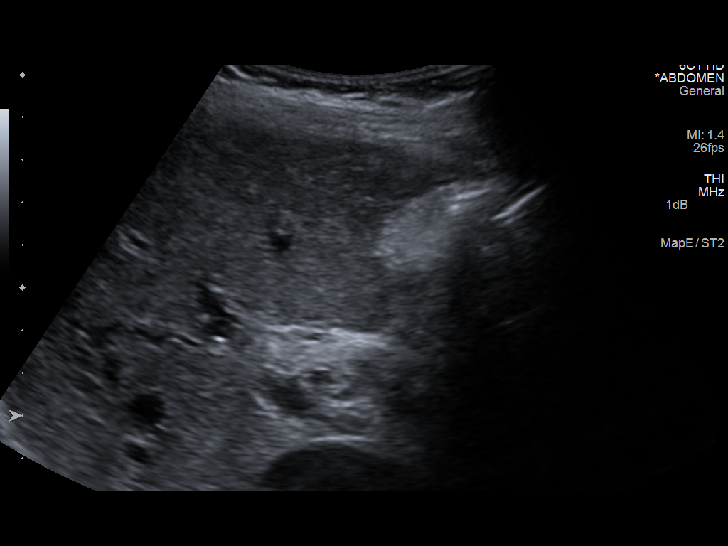
[im 3/31]
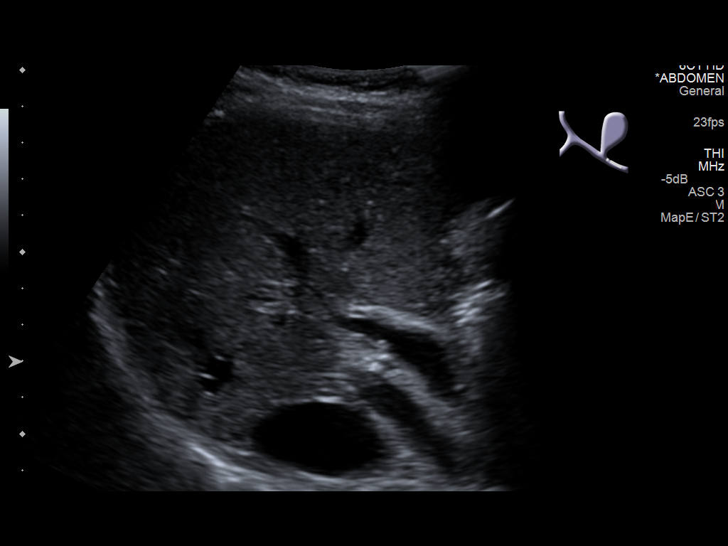
[im 6/31]
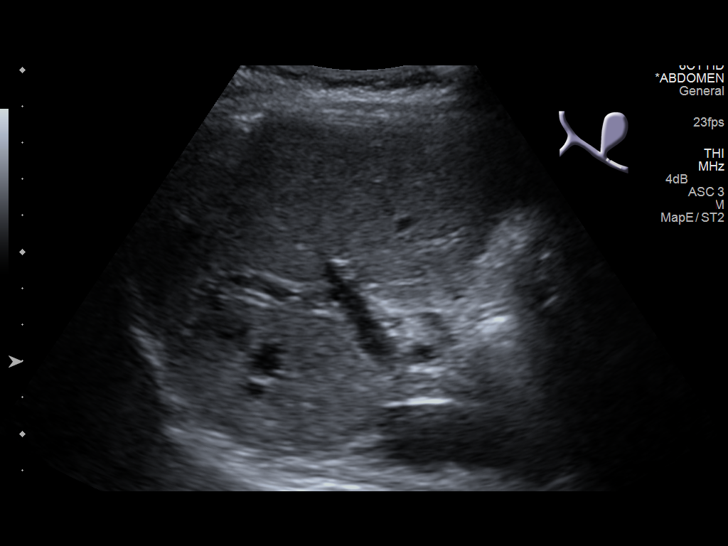
[im 8/31]
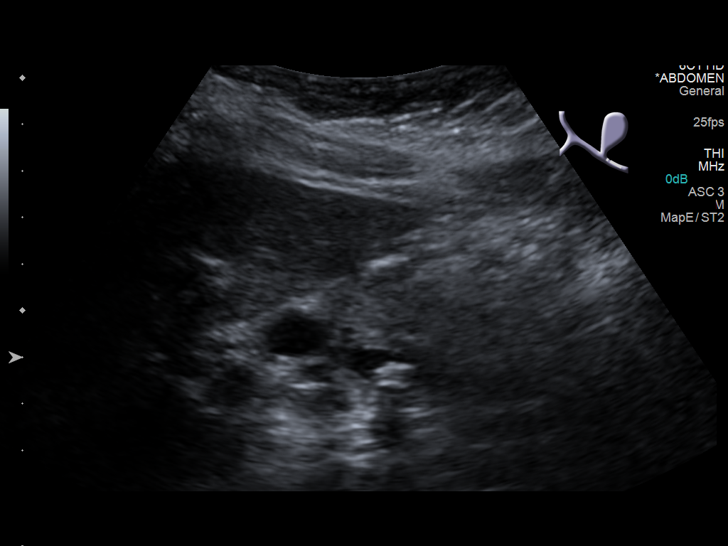
[im 11/31]
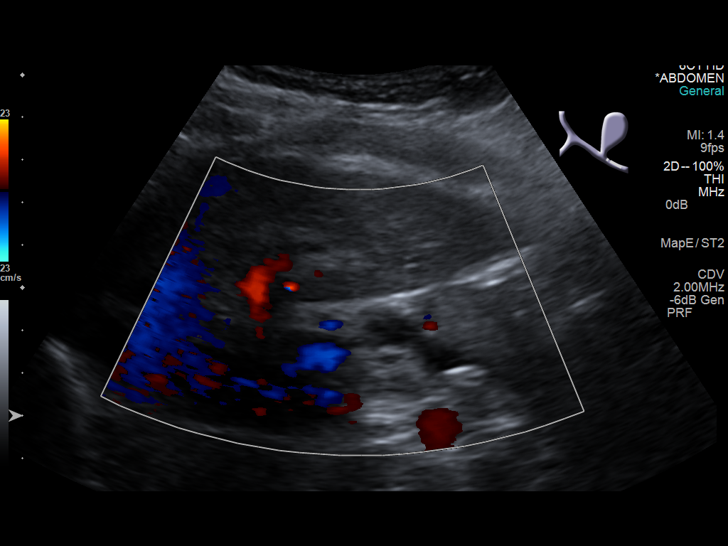
[im 12/31]
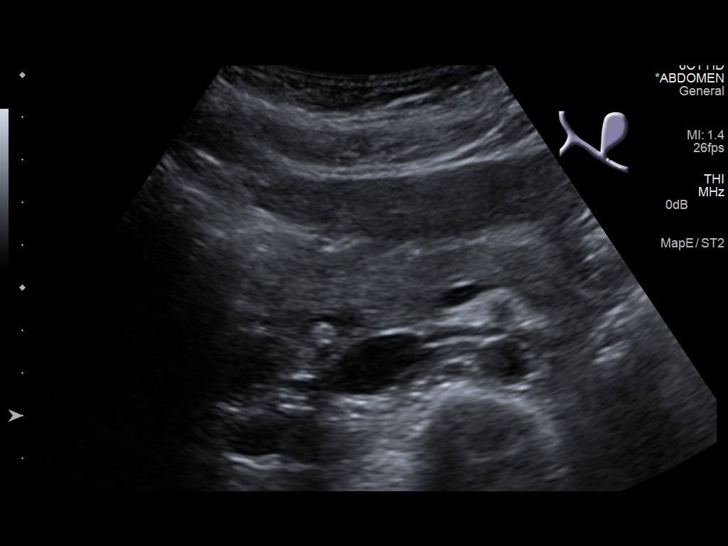
[im 14/31]
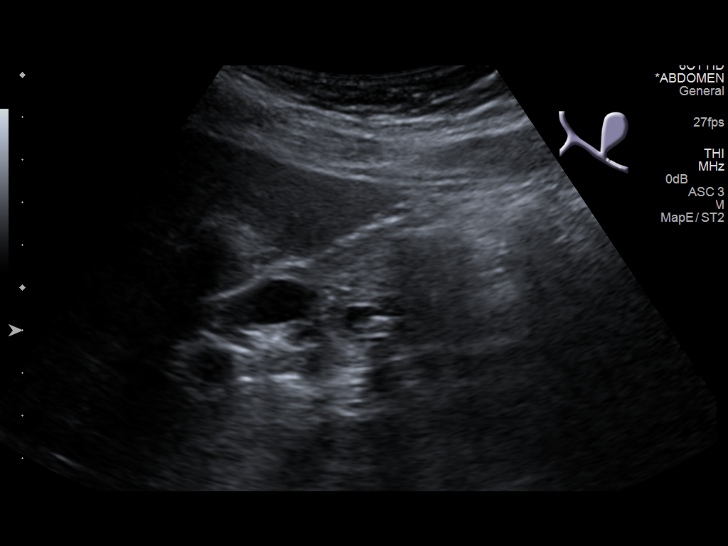
[im 17/31]
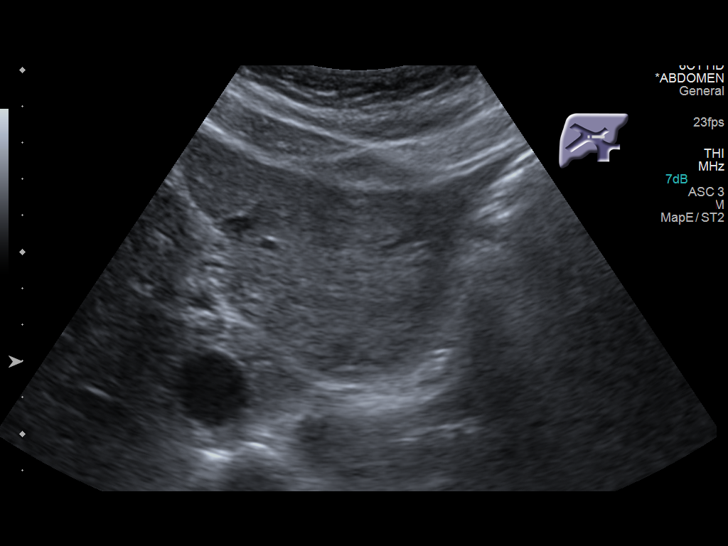
[im 19/31]
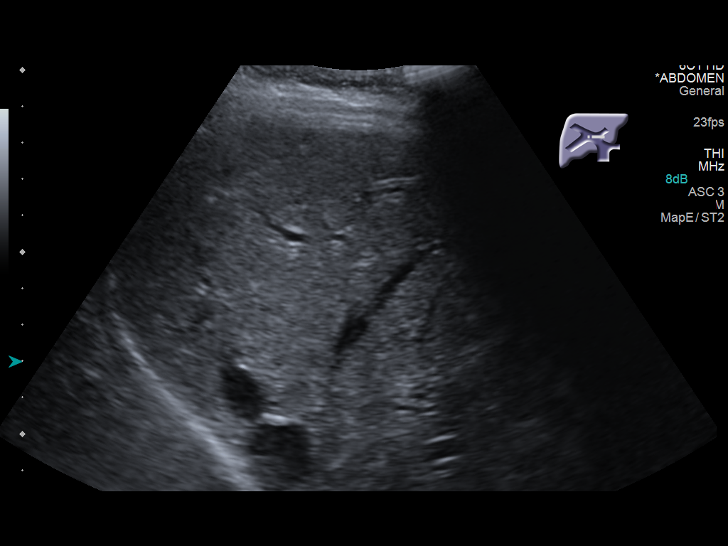
[im 21/31]
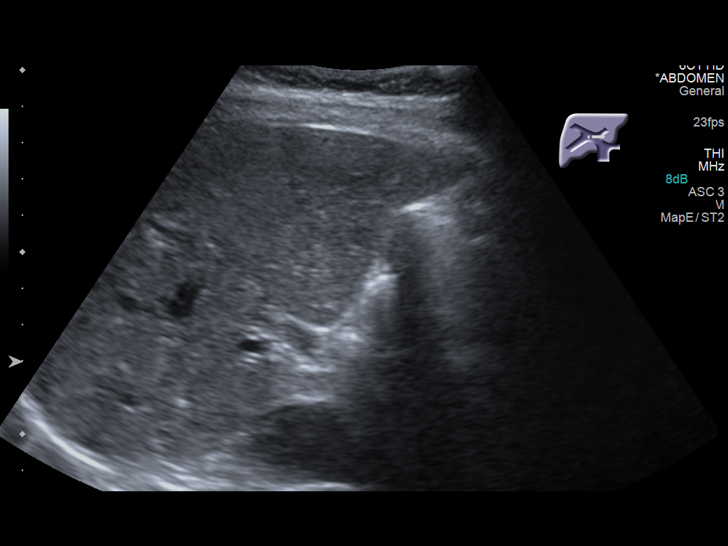
[im 23/31]
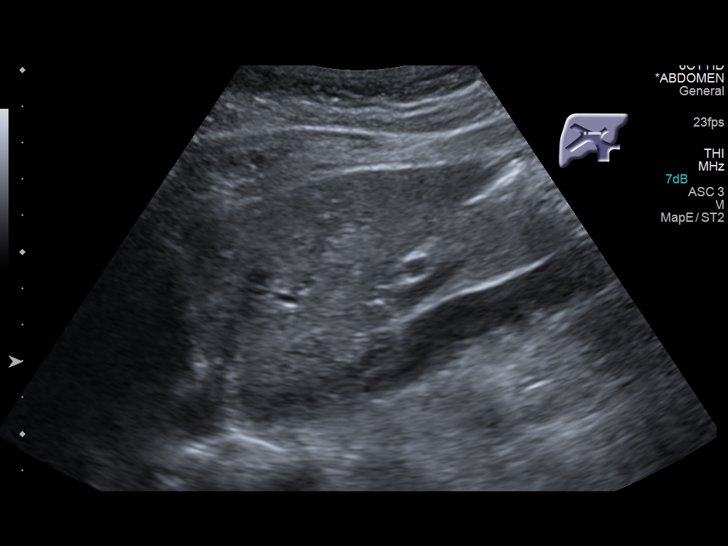
[im 26/31]
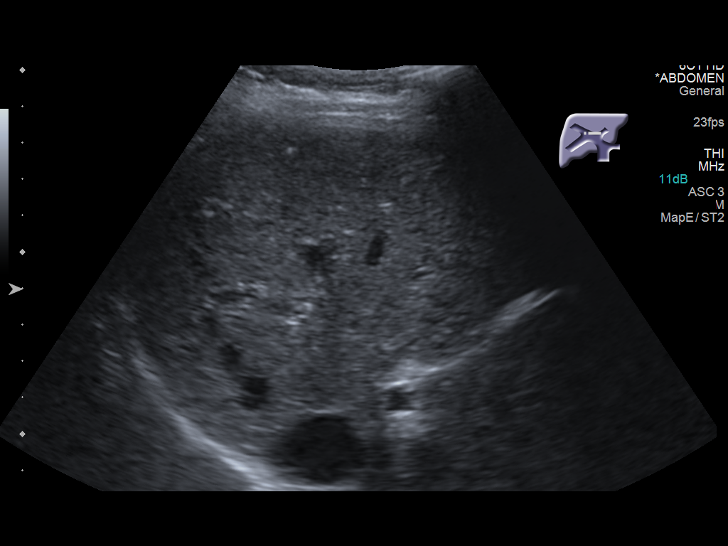
[im 28/31]
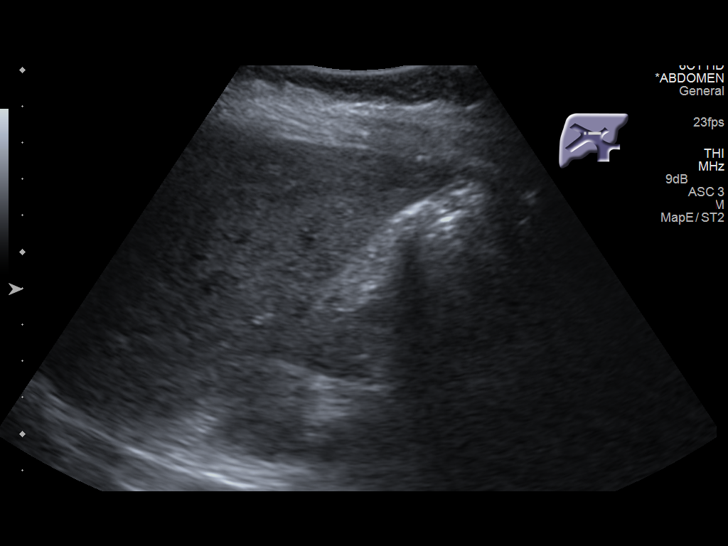
[im 31/31]
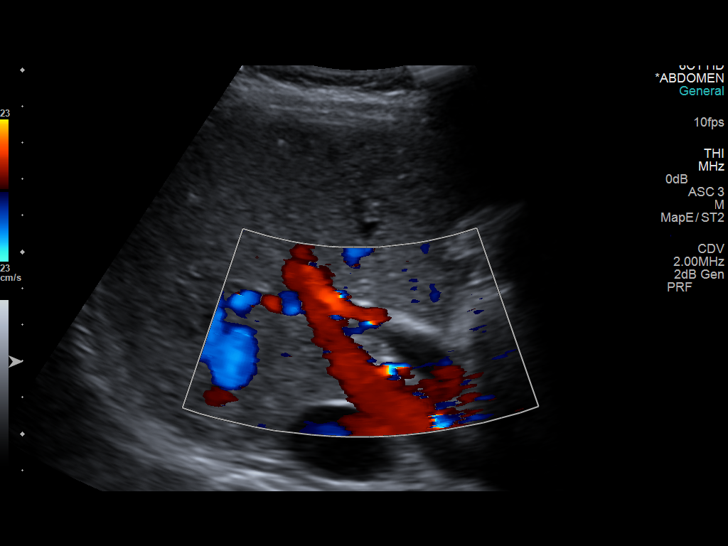

[14 of 25 positions shown; findings below may reference images not displayed]

FINDINGS: Gallbladder:

Surgically absent.

Common bile duct:

Diameter: Dilated at 11 mm. Additionally, there is and echogenic
structure with posterior acoustic shadowing in the distal common
duct measuring 11 x 5 mm.

Liver:

No focal lesion identified. Within normal limits in parenchymal
echogenicity. Mild intrahepatic biliary ductal dilatation.
IMPRESSION: Choledocholithiasis with dilatation of the common bile duct to 11 mm
and perhaps mild central intrahepatic biliary ductal dilatation.

The distal common bile duct stone measures approximately 11 x 5 mm.

## 2019-08-07 ENCOUNTER — Ambulatory Visit: Payer: MEDICAID | Attending: Internal Medicine
